# Patient Record
Sex: Male | Born: 1950
Health system: Southern US, Community
[De-identification: ages and names within clinical notes are randomized; demographics above are authoritative.]

## PROBLEM LIST (undated history)

## (undated) DIAGNOSIS — E8881 Metabolic syndrome: Secondary | ICD-10-CM

## (undated) DIAGNOSIS — I10 Essential (primary) hypertension: Secondary | ICD-10-CM

## (undated) DIAGNOSIS — K219 Gastro-esophageal reflux disease without esophagitis: Secondary | ICD-10-CM

## (undated) DIAGNOSIS — Z86010 Personal history of colonic polyps: Secondary | ICD-10-CM

## (undated) DIAGNOSIS — E1165 Type 2 diabetes mellitus with hyperglycemia: Secondary | ICD-10-CM

## (undated) HISTORY — DX: Type 2 diabetes mellitus with hyperglycemia: E11.65

## (undated) HISTORY — DX: Essential (primary) hypertension: I10

## (undated) HISTORY — DX: Metabolic syndrome: E88.81

## (undated) HISTORY — DX: Gastro-esophageal reflux disease without esophagitis: K21.9

## (undated) HISTORY — PX: TONSILLECTOMY: SUR1361

## (undated) HISTORY — DX: Personal history of colonic polyps: Z86.010

---

## 2002-06-18 ENCOUNTER — Encounter: Payer: Self-pay | Admitting: Cardiology

## 2002-06-18 ENCOUNTER — Ambulatory Visit (HOSPITAL_COMMUNITY): Admission: RE | Admit: 2002-06-18 | Discharge: 2002-06-18 | Payer: Self-pay | Admitting: Cardiology

## 2003-06-05 ENCOUNTER — Ambulatory Visit (HOSPITAL_COMMUNITY): Admission: RE | Admit: 2003-06-05 | Discharge: 2003-06-05 | Payer: Self-pay | Admitting: Gastroenterology

## 2003-06-05 ENCOUNTER — Encounter (INDEPENDENT_AMBULATORY_CARE_PROVIDER_SITE_OTHER): Payer: Self-pay | Admitting: *Deleted

## 2008-06-28 ENCOUNTER — Encounter: Payer: Self-pay | Admitting: Family Medicine

## 2009-01-28 ENCOUNTER — Ambulatory Visit: Payer: Self-pay | Admitting: Family Medicine

## 2009-01-28 LAB — CONVERTED CEMR LAB
ALT: 44 units/L (ref 0–53)
AST: 39 units/L — ABNORMAL HIGH (ref 0–37)
Albumin: 3.9 g/dL (ref 3.5–5.2)
Alkaline Phosphatase: 59 units/L (ref 39–117)
BUN: 26 mg/dL — ABNORMAL HIGH (ref 6–23)
Basophils Absolute: 0 10*3/uL (ref 0.0–0.1)
Basophils Relative: 0.2 % (ref 0.0–3.0)
Bilirubin Urine: NEGATIVE
Bilirubin, Direct: 0.1 mg/dL (ref 0.0–0.3)
Blood in Urine, dipstick: NEGATIVE
CO2: 28 meq/L (ref 19–32)
Calcium: 9.3 mg/dL (ref 8.4–10.5)
Chloride: 102 meq/L (ref 96–112)
Cholesterol: 205 mg/dL — ABNORMAL HIGH (ref 0–200)
Creatinine, Ser: 1.3 mg/dL (ref 0.4–1.5)
Direct LDL: 142.3 mg/dL
Eosinophils Absolute: 0.2 10*3/uL (ref 0.0–0.7)
Eosinophils Relative: 2.9 % (ref 0.0–5.0)
GFR calc non Af Amer: 60.18 mL/min (ref >60)
Glucose, Bld: 184 mg/dL — ABNORMAL HIGH (ref 70–99)
Glucose, Urine, Semiquant: NEGATIVE
HCT: 40.6 % (ref 39.0–52.0)
HDL: 37.8 mg/dL — ABNORMAL LOW (ref >39.00)
Hemoglobin: 14.2 g/dL (ref 13.0–17.0)
Ketones, urine, test strip: NEGATIVE
Lymphocytes Relative: 26.7 % (ref 12.0–46.0)
Lymphs Abs: 1.7 10*3/uL (ref 0.7–4.0)
MCHC: 35 g/dL (ref 30.0–36.0)
MCV: 90.5 fL (ref 78.0–100.0)
Monocytes Absolute: 0.6 10*3/uL (ref 0.1–1.0)
Monocytes Relative: 9.7 % (ref 3.0–12.0)
Neutro Abs: 3.7 10*3/uL (ref 1.4–7.7)
Neutrophils Relative %: 60.5 % (ref 43.0–77.0)
Nitrite: NEGATIVE
PSA: 0.77 ng/mL (ref 0.10–4.00)
Platelets: 237 10*3/uL (ref 150.0–400.0)
Potassium: 4.1 meq/L (ref 3.5–5.1)
RBC: 4.49 M/uL (ref 4.22–5.81)
RDW: 11.8 % (ref 11.5–14.6)
Sodium: 137 meq/L (ref 135–145)
Specific Gravity, Urine: 1.02
TSH: 0.35 microintl units/mL (ref 0.35–5.50)
Total Bilirubin: 1 mg/dL (ref 0.3–1.2)
Total CHOL/HDL Ratio: 5
Total Protein: 7 g/dL (ref 6.0–8.3)
Triglycerides: 186 mg/dL — ABNORMAL HIGH (ref 0.0–149.0)
Urobilinogen, UA: 0.2
VLDL: 37.2 mg/dL (ref 0.0–40.0)
WBC Urine, dipstick: NEGATIVE
WBC: 6.2 10*3/uL (ref 4.5–10.5)
pH: 6

## 2009-01-31 DIAGNOSIS — I10 Essential (primary) hypertension: Secondary | ICD-10-CM

## 2009-01-31 HISTORY — DX: Essential (primary) hypertension: I10

## 2009-02-03 ENCOUNTER — Ambulatory Visit: Payer: Self-pay | Admitting: Family Medicine

## 2009-02-03 DIAGNOSIS — E8881 Metabolic syndrome: Secondary | ICD-10-CM

## 2009-02-03 DIAGNOSIS — Z860100 Personal history of colon polyps, unspecified: Secondary | ICD-10-CM

## 2009-02-03 DIAGNOSIS — IMO0001 Reserved for inherently not codable concepts without codable children: Secondary | ICD-10-CM

## 2009-02-03 DIAGNOSIS — K219 Gastro-esophageal reflux disease without esophagitis: Secondary | ICD-10-CM | POA: Insufficient documentation

## 2009-02-03 DIAGNOSIS — E1165 Type 2 diabetes mellitus with hyperglycemia: Secondary | ICD-10-CM

## 2009-02-03 HISTORY — DX: Personal history of colonic polyps: Z86.010

## 2009-02-03 HISTORY — DX: Metabolic syndrome: E88.81

## 2009-02-03 HISTORY — DX: Reserved for inherently not codable concepts without codable children: IMO0001

## 2009-02-03 HISTORY — DX: Gastro-esophageal reflux disease without esophagitis: K21.9

## 2009-02-03 HISTORY — DX: Metabolic syndrome: E88.810

## 2009-02-03 HISTORY — DX: Personal history of colon polyps, unspecified: Z86.0100

## 2009-02-03 LAB — CONVERTED CEMR LAB
Cholesterol, target level: 200 mg/dL
HDL goal, serum: 40 mg/dL
LDL Goal: 100 mg/dL

## 2009-02-14 ENCOUNTER — Telehealth: Payer: Self-pay | Admitting: Family Medicine

## 2009-04-02 ENCOUNTER — Ambulatory Visit: Payer: Self-pay | Admitting: Family Medicine

## 2009-04-03 LAB — CONVERTED CEMR LAB
Cholesterol: 197 mg/dL (ref 0–200)
Creatinine,U: 122.7 mg/dL
HDL: 37 mg/dL — ABNORMAL LOW (ref >39.00)
Hgb A1c MFr Bld: 6.6 % — ABNORMAL HIGH (ref 4.6–6.5)
LDL Cholesterol: 137 mg/dL — ABNORMAL HIGH (ref 0–99)
Microalb Creat Ratio: 1.6 mg/g (ref 0.0–30.0)
Microalb, Ur: 0.2 mg/dL (ref 0.0–1.9)
TSH: 0.26 microintl units/mL — ABNORMAL LOW (ref 0.35–5.50)
Total CHOL/HDL Ratio: 5
Triglycerides: 115 mg/dL (ref 0.0–149.0)
VLDL: 23 mg/dL (ref 0.0–40.0)

## 2009-04-08 ENCOUNTER — Telehealth: Payer: Self-pay | Admitting: *Deleted

## 2009-05-21 ENCOUNTER — Ambulatory Visit: Payer: Self-pay | Admitting: Family Medicine

## 2009-05-22 LAB — CONVERTED CEMR LAB
Free T4: 0.8 ng/dL (ref 0.6–1.6)
TSH: 1.46 microintl units/mL (ref 0.35–5.50)

## 2009-08-07 ENCOUNTER — Telehealth: Payer: Self-pay | Admitting: Family Medicine

## 2010-01-22 ENCOUNTER — Ambulatory Visit: Payer: Self-pay | Admitting: Family Medicine

## 2010-01-22 LAB — CONVERTED CEMR LAB
ALT: 27 units/L (ref 0–53)
AST: 26 units/L (ref 0–37)
Albumin: 4.2 g/dL (ref 3.5–5.2)
Alkaline Phosphatase: 49 units/L (ref 39–117)
BUN: 18 mg/dL (ref 6–23)
Basophils Absolute: 0 10*3/uL (ref 0.0–0.1)
Basophils Relative: 0.1 % (ref 0.0–3.0)
Bilirubin Urine: NEGATIVE
Bilirubin, Direct: 0.1 mg/dL (ref 0.0–0.3)
Blood in Urine, dipstick: NEGATIVE
CO2: 29 meq/L (ref 19–32)
Calcium: 9.7 mg/dL (ref 8.4–10.5)
Chloride: 100 meq/L (ref 96–112)
Cholesterol: 194 mg/dL (ref 0–200)
Creatinine, Ser: 1.1 mg/dL (ref 0.4–1.5)
Eosinophils Absolute: 0.1 10*3/uL (ref 0.0–0.7)
Eosinophils Relative: 2.2 % (ref 0.0–5.0)
GFR calc non Af Amer: 70.51 mL/min (ref >60)
Glucose, Bld: 105 mg/dL — ABNORMAL HIGH (ref 70–99)
Glucose, Urine, Semiquant: NEGATIVE
HCT: 42.7 % (ref 39.0–52.0)
HDL: 42 mg/dL (ref >39.00)
Hemoglobin: 14.6 g/dL (ref 13.0–17.0)
Ketones, urine, test strip: NEGATIVE
LDL Cholesterol: 119 mg/dL — ABNORMAL HIGH (ref 0–99)
Lymphocytes Relative: 30.3 % (ref 12.0–46.0)
Lymphs Abs: 1.9 10*3/uL (ref 0.7–4.0)
MCHC: 34.1 g/dL (ref 30.0–36.0)
MCV: 91.9 fL (ref 78.0–100.0)
Monocytes Absolute: 0.6 10*3/uL (ref 0.1–1.0)
Monocytes Relative: 8.9 % (ref 3.0–12.0)
Neutro Abs: 3.7 10*3/uL (ref 1.4–7.7)
Neutrophils Relative %: 58.5 % (ref 43.0–77.0)
Nitrite: NEGATIVE
PSA: 0.75 ng/mL (ref 0.10–4.00)
Platelets: 235 10*3/uL (ref 150.0–400.0)
Potassium: 4.7 meq/L (ref 3.5–5.1)
Protein, U semiquant: NEGATIVE
RBC: 4.65 M/uL (ref 4.22–5.81)
RDW: 13.1 % (ref 11.5–14.6)
Sodium: 138 meq/L (ref 135–145)
Specific Gravity, Urine: 1.015
TSH: 1.82 microintl units/mL (ref 0.35–5.50)
Total Bilirubin: 1 mg/dL (ref 0.3–1.2)
Total CHOL/HDL Ratio: 5
Total Protein: 6.8 g/dL (ref 6.0–8.3)
Triglycerides: 166 mg/dL — ABNORMAL HIGH (ref 0.0–149.0)
Urobilinogen, UA: 0.2
VLDL: 33.2 mg/dL (ref 0.0–40.0)
WBC Urine, dipstick: NEGATIVE
WBC: 6.3 10*3/uL (ref 4.5–10.5)
pH: 8.5

## 2010-02-04 ENCOUNTER — Ambulatory Visit: Payer: Self-pay | Admitting: Family Medicine

## 2010-03-02 ENCOUNTER — Ambulatory Visit: Payer: Self-pay | Admitting: Family Medicine

## 2010-08-18 NOTE — Progress Notes (Signed)
Summary: refill Toprol XL for 1 year  Phone Note Call from Patient Call back at Home Phone [phone] Call back at [phone]   Caller: Patient-live call Reason for Call: Refill Medication Summary of Call: Refill Generic Toprol XL. Send rx to [facility]. Initial call taken by: Warnell Forester,  August 07, 2009 1:16 PM    Prescriptions: TOPROL XL 50 MG XR24H-TAB (METOPROLOL SUCCINATE) once daily  #90 x 3   Entered by:   Sid Falcon LPN   Authorized by:   Evelena Peat MD   Signed by:   Sid Falcon LPN on 09/81/1914   Method used:   Faxed to ...       [facility])       [address] [address]       [place], [place]  [address]       Ph: [phone]       Fax: [phone]   RxID:   [id number]

## 2010-08-18 NOTE — Assessment & Plan Note (Signed)
Summary: CPX/CJR   Vital Signs:  Patient profile:   60 year old male Height:      73.50 inches Weight:      234 pounds BMI:     30.56 Temp:     98.8 degrees F oral Pulse rate:   72 / minute Pulse rhythm:   regular Resp:     12 per minute BP sitting:   110 / 80  (left arm) Cuff size:   regular  Vitals Entered By: Sid Falcon LPN (February 04, 2010 9:01 AM)  Nutrition Counseling: Patient's BMI is greater than 25 and therefore counseled on weight management options. CC: CPX, labs done   History of Present Illness: Patient seen for complete physical examination.  Colonoscopy 2009. Repeat 2014. Tetanus 2007. Patient treated for hypertension. Reported history of nonischemic cardiomyopathy several years ago. Has been followed by cardiologist until recently. No recent symptoms of shortness of breath. No chest pains.  Family history and social history reviewed no significant changes.  Clinical Review Panels:  Prevention   Last Colonoscopy:  Adenomatous Polyp (06/28/2008)   Last PSA:  0.75 (01/22/2010)  Immunizations   Last Tetanus Booster:  Historical (02/04/2006)  Lipid Management   Cholesterol:  194 (01/22/2010)   LDL (bad choesterol):  119 (01/22/2010)   HDL (good cholesterol):  42.00 (01/22/2010)  Diabetes Management   HgBA1C:  6.6 (04/02/2009)   Creatinine:  1.1 (01/22/2010)  CBC   WBC:  6.3 (01/22/2010)   RBC:  4.65 (01/22/2010)   Hgb:  14.6 (01/22/2010)   Hct:  42.7 (01/22/2010)   Platelets:  235.0 (01/22/2010)   MCV  91.9 (01/22/2010)   MCHC  34.1 (01/22/2010)   RDW  13.1 (01/22/2010)   PMN:  58.5 (01/22/2010)   Lymphs:  30.3 (01/22/2010)   Monos:  8.9 (01/22/2010)   Eosinophils:  2.2 (01/22/2010)   Basophil:  0.1 (01/22/2010)   Allergies (verified): No Known Drug Allergies  Past History:  Past Medical History: Last updated: 02/03/2009 Hypertension Mild dyslipidemia Type 2 diabetes dx 7/10 Exogenous Obesity Erectile  dysfunction GERD UTI Colonic polyps, hx of hypertensive cardiomyopathy (Echo 2004, EF 49%) Metabolic Syndrome (htn, type 2 dm, dyslipidemia, increased waist/hip)  Family History: Last updated: 02/03/2009 Family history breast cancer, parent Family History High cholesterol Family History Hypertension, parent, grandparent Faamily history heart disease, parent  Family historu diabetes, parent Family history colon cancer, grandparent  Social History: Last updated: 02/03/2009 Occupation:  Land Surveyor Married Never Smoked Alcohol use-no Regular exercise-no  Risk Factors: Exercise: no (02/03/2009)  Risk Factors: Smoking Status: never (02/03/2009) PMH-FH-SH reviewed for relevance  Review of Systems  The patient denies anorexia, fever, weight loss, weight gain, vision loss, decreased hearing, hoarseness, chest pain, syncope, dyspnea on exertion, peripheral edema, prolonged cough, headaches, hemoptysis, abdominal pain, melena, hematochezia, severe indigestion/heartburn, hematuria, incontinence, genital sores, muscle weakness, suspicious skin lesions, transient blindness, difficulty walking, depression, unusual weight change, abnormal bleeding, enlarged lymph nodes, and testicular masses.    Physical Exam  General:  Well-developed,well-nourished,in no acute distress; alert,appropriate and cooperative throughout examination Head:  Normocephalic and atraumatic without obvious abnormalities. No apparent alopecia or balding. Eyes:  No corneal or conjunctival inflammation noted. EOMI. Perrla. Funduscopic exam benign, without hemorrhages, exudates or papilledema. Vision grossly normal. Ears:  External ear exam shows no significant lesions or deformities.  Otoscopic examination reveals clear canals, tympanic membranes are intact bilaterally without bulging, retraction, inflammation or discharge. Hearing is grossly normal bilaterally. Mouth:  Oral mucosa and oropharynx without lesions or  exudates.  Teeth in good repair. Neck:  No deformities, masses, or tenderness noted. Lungs:  Normal respiratory effort, chest expands symmetrically. Lungs are clear to auscultation, no crackles or wheezes. Heart:  Normal rate and regular rhythm. S1 and S2 normal without gallop, murmur, click, rub or other extra sounds. Abdomen:  Bowel sounds positive,abdomen soft and non-tender without masses, organomegaly or hernias noted. Rectal:  No external abnormalities noted. Normal sphincter tone. No rectal masses or tenderness. Prostate:  Prostate gland firm and smooth, no enlargement, nodularity, tenderness, mass, asymmetry or induration. Msk:  No deformity or scoliosis noted of thoracic or lumbar spine.   Extremities:  No clubbing, cyanosis, edema, or deformity noted with normal full range of motion of all joints.   Neurologic:  alert & oriented X3, cranial nerves II-XII intact, and strength normal in all extremities.   Skin:  no rashes and no suspicious lesions.   Psych:  normally interactive, good eye contact, not anxious appearing, and not depressed appearing.     Impression & Recommendations:  Problem # 1:  ROUTINE GENERAL MEDICAL EXAM@HEALTH  CARE FACL (ICD-V70.0) work on weight loss. Establish more regular exercise. Labs reviewed with patient. Immunizations up to date.  Complete Medication List: 1)  Hydrochlorothiazide 25 Mg Tabs (Hydrochlorothiazide) .... Once daily 2)  Aspirin 81 Mg Tabs (Aspirin) .... Once daily 3)  Toprol Xl 50 Mg Xr24h-tab (Metoprolol succinate) .... Once daily 4)  Lisinopril 40 Mg Tabs (Lisinopril) .... Once daily  Patient Instructions: 1)  It is important that you exercise reguarly at least 20 minutes 5 times a week. If you develop chest pain, have severe difficulty breathing, or feel very tired, stop exercising immediately and seek medical attention.  2)  You need to lose weight. Consider a lower calorie diet and regular exercise.  Prescriptions: LISINOPRIL 40 MG  TABS (LISINOPRIL) once daily  #90 x 3   Entered and Authorized by:   Evelena Peat MD   Signed by:   Evelena Peat MD on 02/04/2010   Method used:   Faxed to ...       [facility])       [address] [address]       [place], [place]  [address]       Ph: [phone]       Fax: [phone]   RxID:   [id number] TOPROL XL 50 MG XR24H-TAB (METOPROLOL SUCCINATE) once daily  #90 x 3   Entered and Authorized by:   Evelena Peat MD   Signed by:   Evelena Peat MD on 02/04/2010   Method used:   Faxed to ...       [facility] education [facility])       [address] [address]       [place], [place]  [address]       Ph: [phone]       Fax: [phone]   RxID:   [id number] HYDROCHLOROTHIAZIDE 25 MG TABS (HYDROCHLOROTHIAZIDE) once daily  #90 x 3   Entered and Authorized by:   Evelena Peat MD   Signed by:   Evelena Peat MD on 02/04/2010   Method used:   Faxed to ...       [facility] education officer)       [address] [address]       [place], [place]  [address]       Ph: [phone]       Fax: [phone]   RxID:   [id number]

## 2010-08-18 NOTE — Assessment & Plan Note (Signed)
Summary: KNEE PAIN // RS   Vital Signs:  Patient profile:   60 year old male Weight:      235 pounds Temp:     97.8 degrees F oral BP sitting:   130 / 92  (left arm) Cuff size:   large  Vitals Entered By: Sid Falcon LPN (March 02, 2010 10:37 AM) CC: Injury to left knee 4 days ago   History of Present Illness: Left knee pain and swelling. He recalls hitting his left knee against desk surface last Thursday. Did not have any significant pain immediately. By the weekend noticed some redness and warmth and mild swelling. No systemic fever or chills. Motrin helps.  no history of gout  Allergies (verified): No Known Drug Allergies  Past History:  Past Medical History: Last updated: 02/03/2009 Hypertension Mild dyslipidemia Type 2 diabetes dx 7/10 Exogenous Obesity Erectile dysfunction GERD UTI Colonic polyps, hx of hypertensive cardiomyopathy (Echo 2004, EF 49%) Metabolic Syndrome (htn, type 2 dm, dyslipidemia, increased waist/hip) PMH reviewed for relevance  Physical Exam  General:  Well-developed,well-nourished,in no acute distress; alert,appropriate and cooperative throughout examination Lungs:  Normal respiratory effort, chest expands symmetrically. Lungs are clear to auscultation, no crackles or wheezes. Heart:  Normal rate and regular rhythm. S1 and S2 normal without gallop, murmur, click, rub or other extra sounds. Extremities:  left knee reveals mild anterior swelling. He has an area of redness approximately 8 x 10 cm left anterior knee. No effusion. Slightly warm to touch. Minimally tender. No medial or lateral joint line tenderness. No obvious abrasions to the skin No prepatellar effusion.   Impression & Recommendations:  Problem # 1:  CELLULITIS, KNEE, LEFT (ICD-682.6) keflex, heating pad and follow up one week if no better. His updated medication list for this problem includes:    Cephalexin 500 Mg Caps (Cephalexin) ..... One by mouth three times a day for  10 days  Complete Medication List: 1)  Hydrochlorothiazide 25 Mg Tabs (Hydrochlorothiazide) .... Once daily 2)  Aspirin 81 Mg Tabs (Aspirin) .... Once daily 3)  Toprol Xl 50 Mg Xr24h-tab (Metoprolol succinate) .... Once daily 4)  Lisinopril 40 Mg Tabs (Lisinopril) .... Once daily 5)  Cephalexin 500 Mg Caps (Cephalexin) .... One by mouth three times a day for 10 days  Patient Instructions: 1)  Elevate leg and knee frequently 2)  Consider heating pad on low to medium heat several times daily 3)  Followup if knee not improving in the next few days Prescriptions: CEPHALEXIN 500 MG CAPS (CEPHALEXIN) one by mouth three times a day for 10 days  #30 x 0   Entered and Authorized by:   Evelena Peat MD   Signed by:   Evelena Peat MD on 03/02/2010   Method used:   Electronically to        [facility]  [place] [address] (retail)       [address]       [place], [place]  [address]       Ph: [phone] or [phone]       Fax: [phone]   RxID:   [id number]

## 2010-12-04 NOTE — Op Note (Signed)
Jared Byrd, Jared Byrd                           ACCOUNT NO.:  [id number]   MEDICAL RECORD NO.:  [id number]                   PATIENT TYPE:  AMB   LOCATION:  ENDO                                 FACILITY:  [facility]   PHYSICIAN:  Anselmo Rod, M.D.               DATE OF BIRTH:  [birth year]   DATE OF PROCEDURE:  06/05/2003  DATE OF DISCHARGE:                                 OPERATIVE REPORT   PROCEDURE:  Colonoscopy with snare polypectomy x2.   ENDOSCOPIST:  Anselmo Rod, M.D.   INSTRUMENT USED:  Olympus video colonoscope.   INDICATIONS FOR PROCEDURE:  A 60 year old white male with a family history  of colon cancer in a sister and multiple colonic polyps in his mother  undergoing a screening colonoscopy.   PREPROCEDURE PREPARATION:  Informed consent was procured from the patient.  The patient fasted for eight hours prior to the procedure and prepped with a  bottle of magnesium citrate and a gallon of GoLYTELY the night prior to the  procedure.   PREPROCEDURE PHYSICAL:  The patient had stable vital signs. Neck supple.  Chest clear to auscultation. S1, S2 regular. Abdomen soft with normal bowel  sounds.   DESCRIPTION OF PROCEDURE:  The patient was placed in the left lateral  decubitus position and sedated with 50 mg of Demerol and 5 mg of Versed  intravenously. Once the patient was adequately sedated and maintained on low  flow oxygen and continuous cardiac monitoring, the Olympus video colonoscope  was advanced from the rectum to the cecum without difficulty. The  appendiceal orifice and ileocecal valve were visualized and photographed. A  6-7 mm sessile polyp was snared from 55 cm, another 3-4 mm sessile polyp was  snared from 35 cm. The patient tolerated the procedure well without  complications. The terminal ileum appeared healthy and without lesions.  Retroflexion in the rectum revealed no abnormalities.   IMPRESSION:  Two colonic polyps removed 55 and 35 cm respectively  otherwise  normal colonoscopy of the terminal ileum.   RECOMMENDATIONS:  1. Await pathology results.  2. Avoid all nonsteroidals including aspirin for the next 3-4 weeks.  3. Repeat colorectal cancer screening depending on pathology results.                                               Anselmo Rod, M.D.    JNM/MEDQ  D:  06/05/2003  T:  06/06/2003  Job:  [id number]   cc:   Teena Irani. Arlyce Dice, M.D.  [address]  [place]  [place] [address]  Fax: [phone]

## 2011-01-06 ENCOUNTER — Other Ambulatory Visit (INDEPENDENT_AMBULATORY_CARE_PROVIDER_SITE_OTHER): Payer: BC Managed Care – PPO

## 2011-01-06 DIAGNOSIS — Z0000 Encounter for general adult medical examination without abnormal findings: Secondary | ICD-10-CM

## 2011-01-06 LAB — HEPATIC FUNCTION PANEL
AST: 29 U/L (ref 0–37)
Alkaline Phosphatase: 58 U/L (ref 39–117)
Bilirubin, Direct: 0.2 mg/dL (ref 0.0–0.3)
Total Bilirubin: 1.1 mg/dL (ref 0.3–1.2)

## 2011-01-06 LAB — BASIC METABOLIC PANEL
CO2: 25 mEq/L (ref 19–32)
Calcium: 9.3 mg/dL (ref 8.4–10.5)
Creatinine, Ser: 1.1 mg/dL (ref 0.4–1.5)
GFR: 74.85 mL/min (ref >60.00)
Glucose, Bld: 125 mg/dL — ABNORMAL HIGH (ref 70–99)

## 2011-01-06 LAB — LIPID PANEL
Cholesterol: 243 mg/dL — ABNORMAL HIGH (ref 0–200)
Total CHOL/HDL Ratio: 5
VLDL: 42.4 mg/dL — ABNORMAL HIGH (ref 0.0–40.0)

## 2011-01-06 LAB — CBC WITH DIFFERENTIAL/PLATELET
Basophils Relative: 0.4 % (ref 0.0–3.0)
Eosinophils Relative: 2 % (ref 0.0–5.0)
Lymphs Abs: 1.8 10*3/uL (ref 0.7–4.0)
Monocytes Absolute: 0.6 10*3/uL (ref 0.1–1.0)
Neutro Abs: 3.8 10*3/uL (ref 1.4–7.7)
Platelets: 238 10*3/uL (ref 150.0–400.0)
RBC: 4.58 Mil/uL (ref 4.22–5.81)

## 2011-01-06 LAB — POCT URINALYSIS DIPSTICK
Glucose, UA: NEGATIVE
Ketones, UA: NEGATIVE
Leukocytes, UA: NEGATIVE
Spec Grav, UA: 1.02

## 2011-01-06 LAB — LDL CHOLESTEROL, DIRECT: Direct LDL: 174.1 mg/dL

## 2011-01-11 ENCOUNTER — Encounter: Payer: Self-pay | Admitting: Family Medicine

## 2011-01-13 ENCOUNTER — Ambulatory Visit (INDEPENDENT_AMBULATORY_CARE_PROVIDER_SITE_OTHER): Payer: BC Managed Care – PPO | Admitting: Family Medicine

## 2011-01-13 ENCOUNTER — Encounter: Payer: Self-pay | Admitting: Family Medicine

## 2011-01-13 VITALS — BP 110/80 | HR 72 | Temp 98.1°F | Resp 12 | Ht 73.0 in | Wt 249.0 lb

## 2011-01-13 DIAGNOSIS — E785 Hyperlipidemia, unspecified: Secondary | ICD-10-CM

## 2011-01-13 DIAGNOSIS — R7309 Other abnormal glucose: Secondary | ICD-10-CM

## 2011-01-13 DIAGNOSIS — R7303 Prediabetes: Secondary | ICD-10-CM

## 2011-01-13 DIAGNOSIS — Z0000 Encounter for general adult medical examination without abnormal findings: Secondary | ICD-10-CM

## 2011-01-13 MED ORDER — HYDROCHLOROTHIAZIDE 25 MG PO TABS: 25.0000 mg | ORAL_TABLET | Freq: Every day | ORAL | Status: DC

## 2011-01-13 MED ORDER — LISINOPRIL 40 MG PO TABS: 40.0000 mg | ORAL_TABLET | Freq: Every day | ORAL | Status: DC

## 2011-01-13 MED ORDER — METOPROLOL SUCCINATE ER 50 MG PO TB24: 50.0000 mg | ORAL_TABLET | Freq: Every day | ORAL | Status: DC

## 2011-01-13 NOTE — Patient Instructions (Signed)
Work on establishing more regular exercise.   Work on gradual weight loss Reduce high glycemic foods Check on insurance coverage for shingles vaccine Return in approximately 6 months for fasting glucose and lipid panel

## 2011-01-13 NOTE — Progress Notes (Signed)
  Subjective:    Patient ID: Jared Byrd, male    DOB: 09-09-1950, 60 y.o.   MRN: [id number]  HPI Patient here for complete physical examination. Hypertension treated with 3 drug regimen. Blood pressure well controlled. No orthostasis. Tetanus up-to-date. Last colonoscopy around 2009. Due for repeat in a couple of years. Previous history of colon polyps.  Prior history prediabetes. No regular exercise. Poorly compliant with diet off and on her past year. No symptoms of hyperglycemia.   Review of Systems  Constitutional: Negative for fever, activity change, appetite change and fatigue.  HENT: Negative for ear pain, congestion and trouble swallowing.   Eyes: Negative for pain and visual disturbance.  Respiratory: Negative for cough, shortness of breath and wheezing.   Cardiovascular: Negative for chest pain and palpitations.  Gastrointestinal: Negative for nausea, vomiting, abdominal pain, diarrhea, constipation, blood in stool, abdominal distention and rectal pain.  Genitourinary: Negative for dysuria, hematuria and testicular pain.  Musculoskeletal: Negative for joint swelling and arthralgias.  Skin: Negative for rash.  Neurological: Negative for dizziness, syncope and headaches.  Hematological: Negative for adenopathy.  Psychiatric/Behavioral: Negative for confusion and dysphoric mood.       Objective:   Physical Exam  Constitutional: He is oriented to person, place, and time. He appears well-developed and well-nourished. No distress.  HENT:  Head: Normocephalic and atraumatic.  Right Ear: External ear normal.  Left Ear: External ear normal.  Mouth/Throat: Oropharynx is clear and moist.  Eyes: Conjunctivae and EOM are normal. Pupils are equal, round, and reactive to light.  Neck: Normal range of motion. Neck supple. No thyromegaly present.  Cardiovascular: Normal rate, regular rhythm and normal heart sounds.   No murmur heard. Pulmonary/Chest: No respiratory distress. He has no  wheezes. He has no rales.  Abdominal: Soft. Bowel sounds are normal. He exhibits no distension and no mass. There is no tenderness. There is no rebound and no guarding.  Musculoskeletal: He exhibits no edema.  Lymphadenopathy:    He has no cervical adenopathy.  Neurological: He is alert and oriented to person, place, and time. He displays normal reflexes. No cranial nerve deficit.  Skin: No rash noted.  Psychiatric: He has a normal mood and affect. His behavior is normal.          Assessment & Plan:  Complete physical examination. Tetanus up-to-date. Colonoscopy up to date. Check on insurance coverage for shingles vaccine. Establish more regular exercise. Return 6 months and repeat lipids and glucose.

## 2011-05-04 ENCOUNTER — Ambulatory Visit (INDEPENDENT_AMBULATORY_CARE_PROVIDER_SITE_OTHER): Payer: BC Managed Care – PPO | Admitting: Family Medicine

## 2011-05-04 DIAGNOSIS — Z23 Encounter for immunization: Secondary | ICD-10-CM

## 2011-07-06 ENCOUNTER — Other Ambulatory Visit: Payer: BC Managed Care – PPO

## 2011-07-15 ENCOUNTER — Ambulatory Visit: Payer: BC Managed Care – PPO | Admitting: Family Medicine

## 2011-08-04 ENCOUNTER — Other Ambulatory Visit (INDEPENDENT_AMBULATORY_CARE_PROVIDER_SITE_OTHER): Payer: BC Managed Care – PPO

## 2011-08-04 DIAGNOSIS — R7309 Other abnormal glucose: Secondary | ICD-10-CM

## 2011-08-04 DIAGNOSIS — E785 Hyperlipidemia, unspecified: Secondary | ICD-10-CM

## 2011-08-04 DIAGNOSIS — R7303 Prediabetes: Secondary | ICD-10-CM

## 2011-08-04 LAB — LIPID PANEL
HDL: 41 mg/dL (ref >39.00)
VLDL: 53.8 mg/dL — ABNORMAL HIGH (ref 0.0–40.0)

## 2011-08-04 LAB — LDL CHOLESTEROL, DIRECT: Direct LDL: 150.2 mg/dL

## 2011-08-11 ENCOUNTER — Encounter: Payer: Self-pay | Admitting: Family Medicine

## 2011-08-11 ENCOUNTER — Ambulatory Visit (INDEPENDENT_AMBULATORY_CARE_PROVIDER_SITE_OTHER): Payer: BC Managed Care – PPO | Admitting: Family Medicine

## 2011-08-11 DIAGNOSIS — I10 Essential (primary) hypertension: Secondary | ICD-10-CM

## 2011-08-11 DIAGNOSIS — E8881 Metabolic syndrome: Secondary | ICD-10-CM

## 2011-08-11 NOTE — Patient Instructions (Signed)
Establish more regular exercise and continue with weight loss efforts.

## 2011-08-11 NOTE — Progress Notes (Signed)
  Subjective:    Patient ID: Jared Byrd, male    DOB: May 30, 1951, 61 y.o.   MRN: [id number]  HPI  Patient is seen for followup. Has metabolic syndrome with type 2 diabetes, hypertension, low HDL and high triglyceride. Blood pressure well controlled. Recent labs significant for fasting glucose 165 with dyslipidemia. He has been fairly compliant with diet but not exercising any. Blood pressure well controlled. No symptoms of hyperglycemia. He has been very reluctant to consider additional medications.  Past Medical History  Diagnosis Date  . DIAB W/O MENTION COMP TYPE II/UNS TYPE UNCNTRL 02/03/2009  . METABOLIC SYNDROME X 02/03/2009  . HYPERTENSION 01/31/2009  . GERD 02/03/2009  . COLONIC POLYPS, HX OF 02/03/2009   No past surgical history on file.  reports that he quit smoking about 35 years ago. His smoking use included Cigarettes. He has a 5 pack-year smoking history. He does not have any smokeless tobacco history on file. His alcohol and drug histories not on file. family history includes Cancer in his mother and other; Cancer (age of onset:48) in his sister; Heart disease in his other; Hyperlipidemia in his other; and Hypertension in his other. No Known Allergies    Review of Systems  Constitutional: Negative for fatigue.  Eyes: Negative for visual disturbance.  Respiratory: Negative for cough, chest tightness and shortness of breath.   Cardiovascular: Negative for chest pain, palpitations and leg swelling.  Neurological: Negative for dizziness, syncope, weakness, light-headedness and headaches.       Objective:   Physical Exam  Constitutional: He appears well-developed and well-nourished.  HENT:  Mouth/Throat: Oropharynx is clear and moist.  Neck: Neck supple. No thyromegaly present.  Cardiovascular: Normal rate and regular rhythm.   No murmur heard. Pulmonary/Chest: Effort normal and breath sounds normal. No respiratory distress. He has no wheezes. He has no rales.    Musculoskeletal: He exhibits no edema.  Lymphadenopathy:    He has no cervical adenopathy.          Assessment & Plan:  #1 type 2 diabetes. Elevated fasting glucose as above. Discussed possible medication options. Patient refers weight loss. Increase exercise and weight loss and reassess A1c in 3 months .  Metformin at that time if A1C not to goal. #2 dyslipidemia. We stated a goal LDL less than 100. Patient reluctant to try statin. Repeat lipid followup. Remain on omega-3 supplement  #3 hypertension stable at goal currently medication

## 2011-11-10 ENCOUNTER — Other Ambulatory Visit (INDEPENDENT_AMBULATORY_CARE_PROVIDER_SITE_OTHER): Payer: BC Managed Care – PPO

## 2011-11-10 DIAGNOSIS — E8881 Metabolic syndrome: Secondary | ICD-10-CM

## 2011-11-10 LAB — LIPID PANEL
Cholesterol: 202 mg/dL — ABNORMAL HIGH (ref 0–200)
HDL: 43.6 mg/dL (ref >39.00)
Total CHOL/HDL Ratio: 5
Triglycerides: 166 mg/dL — ABNORMAL HIGH (ref 0.0–149.0)
VLDL: 33.2 mg/dL (ref 0.0–40.0)

## 2011-11-10 LAB — HEMOGLOBIN A1C: Hgb A1c MFr Bld: 7.1 % — ABNORMAL HIGH (ref 4.6–6.5)

## 2011-11-30 ENCOUNTER — Telehealth: Payer: Self-pay | Admitting: Family Medicine

## 2011-11-30 NOTE — Telephone Encounter (Signed)
Pt called and is req to get lab results from April. Pls call.

## 2011-11-30 NOTE — Telephone Encounter (Signed)
Labs mailed to pt home for review

## 2012-01-06 ENCOUNTER — Other Ambulatory Visit (INDEPENDENT_AMBULATORY_CARE_PROVIDER_SITE_OTHER): Payer: BC Managed Care – PPO

## 2012-01-06 DIAGNOSIS — Z0000 Encounter for general adult medical examination without abnormal findings: Secondary | ICD-10-CM

## 2012-01-06 LAB — CBC WITH DIFFERENTIAL/PLATELET
Basophils Absolute: 0 10*3/uL (ref 0.0–0.1)
Eosinophils Relative: 2.1 % (ref 0.0–5.0)
Hemoglobin: 14.3 g/dL (ref 13.0–17.0)
Lymphocytes Relative: 26.1 % (ref 12.0–46.0)
Monocytes Relative: 7.1 % (ref 3.0–12.0)
Neutro Abs: 3.9 10*3/uL (ref 1.4–7.7)
RBC: 4.73 Mil/uL (ref 4.22–5.81)
RDW: 12.5 % (ref 11.5–14.6)
WBC: 6 10*3/uL (ref 4.5–10.5)

## 2012-01-06 LAB — POCT URINALYSIS DIPSTICK
Blood, UA: NEGATIVE
Ketones, UA: NEGATIVE
Protein, UA: NEGATIVE
Spec Grav, UA: 1.01

## 2012-01-06 LAB — BASIC METABOLIC PANEL
Calcium: 9.7 mg/dL (ref 8.4–10.5)
GFR: 63.52 mL/min (ref >60.00)
Glucose, Bld: 113 mg/dL — ABNORMAL HIGH (ref 70–99)
Sodium: 136 mEq/L (ref 135–145)

## 2012-01-06 LAB — MICROALBUMIN / CREATININE URINE RATIO
Creatinine,U: 73.5 mg/dL
Microalb Creat Ratio: 1.1 mg/g (ref 0.0–30.0)
Microalb, Ur: 0.8 mg/dL (ref 0.0–1.9)

## 2012-01-06 LAB — PSA: PSA: 1.08 ng/mL (ref 0.10–4.00)

## 2012-01-06 LAB — HEPATIC FUNCTION PANEL
AST: 21 U/L (ref 0–37)
Albumin: 4.1 g/dL (ref 3.5–5.2)
Alkaline Phosphatase: 64 U/L (ref 39–117)

## 2012-01-06 LAB — LIPID PANEL
HDL: 41.6 mg/dL (ref >39.00)
Total CHOL/HDL Ratio: 4
VLDL: 31.4 mg/dL (ref 0.0–40.0)

## 2012-01-17 ENCOUNTER — Telehealth: Payer: Self-pay | Admitting: Family Medicine

## 2012-01-17 ENCOUNTER — Ambulatory Visit (INDEPENDENT_AMBULATORY_CARE_PROVIDER_SITE_OTHER): Payer: BC Managed Care – PPO | Admitting: Family Medicine

## 2012-01-17 ENCOUNTER — Encounter: Payer: Self-pay | Admitting: Family Medicine

## 2012-01-17 VITALS — BP 110/70 | HR 68 | Temp 98.6°F | Resp 12 | Wt 236.0 lb

## 2012-01-17 DIAGNOSIS — Z0000 Encounter for general adult medical examination without abnormal findings: Secondary | ICD-10-CM

## 2012-01-17 DIAGNOSIS — Z23 Encounter for immunization: Secondary | ICD-10-CM

## 2012-01-17 MED ORDER — LISINOPRIL 40 MG PO TABS: 40.0000 mg | ORAL_TABLET | Freq: Every day | ORAL | Status: DC

## 2012-01-17 MED ORDER — ZOSTER VACCINE LIVE 19400 UNT/0.65ML ~~LOC~~ SOLR: 0.6500 mL | Freq: Once | SUBCUTANEOUS | Status: AC

## 2012-01-17 MED ORDER — METOPROLOL SUCCINATE ER 50 MG PO TB24: 50.0000 mg | ORAL_TABLET | Freq: Every day | ORAL | Status: DC

## 2012-01-17 MED ORDER — HYDROCHLOROTHIAZIDE 25 MG PO TABS: 25.0000 mg | ORAL_TABLET | Freq: Every day | ORAL | Status: DC

## 2012-01-17 NOTE — Telephone Encounter (Signed)
noted 

## 2012-01-17 NOTE — Telephone Encounter (Signed)
Pt called and said that he would like to rcv the shingles vax, tdap and pneumonia vax, when pt come in for cpx today at 3pm. Pt said that his insurance will cover the shingles vax.

## 2012-01-17 NOTE — Patient Instructions (Signed)
Continue with weight loss efforts.  

## 2012-01-17 NOTE — Progress Notes (Signed)
  Subjective:    Patient ID: Jared Byrd, male    DOB: 1950-12-09, 61 y.o.   MRN: [id number]  HPI  Complete physical. Patient has history of obesity, type 2 diabetes, hypertension, dyslipidemia and metabolic syndrome. He has made some positive lifestyle changes and has lost some weight over past year. Blood sugars have improved. Current medications are HCTZ, aspirin, lisinopril, and metoprolol. Somewhat inconsistent exercise. No history of Pneumovax. No history of shingles vaccine. Last tetanus unknown. Is receiving regular colonoscopies with positive family history of colon cancer sister age 69. He'll be due for repeat colonoscopy next year. Nonsmoker.  Past Medical History  Diagnosis Date  . DIAB W/O MENTION COMP TYPE II/UNS TYPE UNCNTRL 02/03/2009  . METABOLIC SYNDROME X 02/03/2009  . HYPERTENSION 01/31/2009  . GERD 02/03/2009  . COLONIC POLYPS, HX OF 02/03/2009   No past surgical history on file.  reports that he quit smoking about 36 years ago. His smoking use included Cigarettes. He has a 5 pack-year smoking history. He does not have any smokeless tobacco history on file. His alcohol and drug histories not on file. family history includes Cancer in his mother and other; Cancer (age of onset:48) in his sister; Diabetes in his mother; Heart disease in his father and other; Hyperlipidemia in his other; and Hypertension in his father and other. No Known Allergies    Review of Systems  Constitutional: Negative for fever, activity change, appetite change and fatigue.  HENT: Negative for ear pain, congestion and trouble swallowing.   Eyes: Negative for pain and visual disturbance.  Respiratory: Negative for cough, shortness of breath and wheezing.   Cardiovascular: Negative for chest pain and palpitations.  Gastrointestinal: Negative for nausea, vomiting, abdominal pain, diarrhea, constipation, blood in stool, abdominal distention and rectal pain.  Genitourinary: Negative for dysuria,  hematuria and testicular pain.  Musculoskeletal: Negative for joint swelling and arthralgias.  Skin: Negative for rash.  Neurological: Negative for dizziness, syncope and headaches.  Hematological: Negative for adenopathy.  Psychiatric/Behavioral: Negative for confusion and dysphoric mood.       Objective:   Physical Exam  Constitutional: He is oriented to person, place, and time. He appears well-developed and well-nourished. No distress.  HENT:  Head: Normocephalic and atraumatic.  Right Ear: External ear normal.  Left Ear: External ear normal.  Mouth/Throat: Oropharynx is clear and moist.  Eyes: Conjunctivae and EOM are normal. Pupils are equal, round, and reactive to light.  Neck: Normal range of motion. Neck supple. No thyromegaly present.  Cardiovascular: Normal rate, regular rhythm and normal heart sounds.   No murmur heard. Pulmonary/Chest: No respiratory distress. He has no wheezes. He has no rales.  Abdominal: Soft. Bowel sounds are normal. He exhibits no distension and no mass. There is no tenderness. There is no rebound and no guarding.  Musculoskeletal: He exhibits no edema.  Lymphadenopathy:    He has no cervical adenopathy.  Neurological: He is alert and oriented to person, place, and time. He displays normal reflexes. No cranial nerve deficit.  Skin: No rash noted.  Psychiatric: He has a normal mood and affect.          Assessment & Plan:  Complete physical. Patient needs immunizations including Pneumovax, tetanus, and shingles vaccine. No contraindications. Labs reviewed with patient and overall improved compared to year ago. Needs to continue weight loss and more consistent exercise. Repeat colonoscopy in the next year.

## 2012-11-20 ENCOUNTER — Telehealth: Payer: Self-pay | Admitting: Family Medicine

## 2012-11-20 MED ORDER — INDOMETHACIN 50 MG PO CAPS: 50.0000 mg | ORAL_CAPSULE | Freq: Three times a day (TID) | ORAL | Status: DC

## 2012-11-20 NOTE — Telephone Encounter (Signed)
Patient Information:  Caller Name: Fannie Knee  Phone: [phone]  Patient: Jared Byrd, Jared Byrd  Gender: Male  DOB: March 07, 1951  Age: 62 Years  PCP: Evelena Peat [facility])  Office Follow Up:  Does the office need to follow up with this patient?: Yes  Instructions For The Office: OFFICE PLEASE FOLLOW UP WITH PT AND/OR CALLER REGARDING MEDICATION FOR GOUT TO BE CALLED IN.  RN Note:  Caller reports pt has had Gout before (and has used some of his brothers medication to help).  Caller states pt is due for physical in July 2014 and would prefer to not come in for an appt due to the deductible being 5500$.  Caller would like a prescription for GOUT [facility] [place].  Symptoms  Reason For Call & Symptoms: gout symptoms in right foot (toe is red on top and swollen)  Reviewed Health History In EMR: Yes  Reviewed Medications In EMR: Yes  Reviewed Allergies In EMR: Yes  Reviewed Surgeries / Procedures: Yes  Date of Onset of Symptoms: 11/19/2012  Guideline(s) Used:  Foot Pain  Disposition Per Guideline:   See Within 3 Days in Office  Reason For Disposition Reached:   Pain in the big toe joint  Advice Given:  N/A  Patient Refused Recommendation:  Patient Requests Prescription  caller is requesting a RX for GOUT [facility] [place]

## 2012-11-20 NOTE — Telephone Encounter (Signed)
Please advise for gout med

## 2012-11-20 NOTE — Telephone Encounter (Signed)
Can call in Indocin 50 mg po q 8 hours prn #30 with no refills but he really needs office visit to assess if not promptly improving.

## 2012-11-20 NOTE — Telephone Encounter (Signed)
Wife informed

## 2012-12-17 ENCOUNTER — Other Ambulatory Visit: Payer: Self-pay | Admitting: Family Medicine

## 2013-01-12 ENCOUNTER — Other Ambulatory Visit (INDEPENDENT_AMBULATORY_CARE_PROVIDER_SITE_OTHER): Payer: BC Managed Care – PPO

## 2013-01-12 DIAGNOSIS — Z0000 Encounter for general adult medical examination without abnormal findings: Secondary | ICD-10-CM

## 2013-01-12 LAB — CBC WITH DIFFERENTIAL/PLATELET
Eosinophils Relative: 1.5 % (ref 0.0–5.0)
HCT: 43.4 % (ref 39.0–52.0)
Lymphs Abs: 2 10*3/uL (ref 0.7–4.0)
MCV: 90.7 fl (ref 78.0–100.0)
Monocytes Absolute: 0.5 10*3/uL (ref 0.1–1.0)
Neutro Abs: 4.4 10*3/uL (ref 1.4–7.7)
Platelets: 259 10*3/uL (ref 150.0–400.0)
RDW: 13.3 % (ref 11.5–14.6)

## 2013-01-12 LAB — BASIC METABOLIC PANEL
BUN: 23 mg/dL (ref 6–23)
Chloride: 100 mEq/L (ref 96–112)
Glucose, Bld: 161 mg/dL — ABNORMAL HIGH (ref 70–99)
Potassium: 4.8 mEq/L (ref 3.5–5.1)

## 2013-01-12 LAB — HEPATIC FUNCTION PANEL
ALT: 28 U/L (ref 0–53)
AST: 23 U/L (ref 0–37)
Albumin: 4.2 g/dL (ref 3.5–5.2)

## 2013-01-12 LAB — TSH: TSH: 1.86 u[IU]/mL (ref 0.35–5.50)

## 2013-01-12 LAB — POCT URINALYSIS DIPSTICK
Blood, UA: NEGATIVE
Protein, UA: NEGATIVE
Spec Grav, UA: 1.01
Urobilinogen, UA: 0.2

## 2013-01-12 LAB — LIPID PANEL: Total CHOL/HDL Ratio: 5

## 2013-01-12 LAB — LDL CHOLESTEROL, DIRECT: Direct LDL: 145 mg/dL

## 2013-01-17 ENCOUNTER — Encounter: Payer: Self-pay | Admitting: Family Medicine

## 2013-01-17 ENCOUNTER — Ambulatory Visit (INDEPENDENT_AMBULATORY_CARE_PROVIDER_SITE_OTHER): Payer: BC Managed Care – PPO | Admitting: Family Medicine

## 2013-01-17 VITALS — BP 116/76 | HR 72 | Temp 98.3°F | Ht 73.0 in | Wt 238.0 lb

## 2013-01-17 DIAGNOSIS — M109 Gout, unspecified: Secondary | ICD-10-CM

## 2013-01-17 DIAGNOSIS — E785 Hyperlipidemia, unspecified: Secondary | ICD-10-CM

## 2013-01-17 DIAGNOSIS — IMO0001 Reserved for inherently not codable concepts without codable children: Secondary | ICD-10-CM

## 2013-01-17 DIAGNOSIS — Z0000 Encounter for general adult medical examination without abnormal findings: Secondary | ICD-10-CM

## 2013-01-17 LAB — HEMOGLOBIN A1C: Hgb A1c MFr Bld: 7.8 % — ABNORMAL HIGH (ref 4.6–6.5)

## 2013-01-17 MED ORDER — ATORVASTATIN CALCIUM 10 MG PO TABS: 10.0000 mg | ORAL_TABLET | Freq: Every day | ORAL | Status: DC

## 2013-01-17 NOTE — Patient Instructions (Addendum)
Lose some weight Reduce processed sugars and white starches. Establish more consistent exercise. Continue baby aspirin one daily.

## 2013-01-17 NOTE — Progress Notes (Signed)
Subjective:    Patient ID: Jared Byrd, male    DOB: 30-Aug-1950, 62 y.o.   MRN: [id number]  HPI Patient here for complete physical. He has history of metabolic syndrome, hypertension, dyslipidemia, type 2 diabetes currently untreated with medication, and history of GERD. History of benign colon polyps and due for repeat colonoscopy later this year.  Current medications reviewed include lisinopril, HCTZ, Toprol, and baby aspirin He has history of gout with and infrequent flareups. History metabolic syndrome. He had previous elevated blood sugars but never treated with medication. No recent symptoms of hyperglycemia. Poor compliance during the past year with exercise and diet.  Poor compliance recently with diet and exercise. Had some mild weight gain during the past year. Not monitoring blood sugars. Immunizations up to date.  Past Medical History  Diagnosis Date  . DIAB W/O MENTION COMP TYPE II/UNS TYPE UNCNTRL 02/03/2009  . METABOLIC SYNDROME X 02/03/2009  . HYPERTENSION 01/31/2009  . GERD 02/03/2009  . COLONIC POLYPS, HX OF 02/03/2009   No past surgical history on file.  reports that he quit smoking about 37 years ago. His smoking use included Cigarettes. He has a 5 pack-year smoking history. He does not have any smokeless tobacco history on file. His alcohol and drug histories are not on file. family history includes Cancer in his mother and other; Cancer (age of onset: 24) in his sister; Diabetes in his mother; Heart disease in his father and other; Hyperlipidemia in his other; Hypertension in his father and other; and Multiple endocrine neoplasia in his father. No Known Allergies    Review of Systems  Constitutional: Negative for fever, activity change, appetite change and fatigue.  HENT: Negative for ear pain, congestion and trouble swallowing.   Eyes: Negative for pain and visual disturbance.  Respiratory: Negative for cough, shortness of breath and wheezing.   Cardiovascular:  Negative for chest pain and palpitations.  Gastrointestinal: Negative for nausea, vomiting, abdominal pain, diarrhea, constipation, blood in stool, abdominal distention and rectal pain.  Genitourinary: Negative for dysuria, hematuria and testicular pain.  Musculoskeletal: Negative for joint swelling and arthralgias.  Skin: Negative for rash.  Neurological: Negative for dizziness, syncope and headaches.  Hematological: Negative for adenopathy.  Psychiatric/Behavioral: Negative for confusion and dysphoric mood.       Objective:   Physical Exam  Constitutional: He is oriented to person, place, and time. He appears well-developed and well-nourished. No distress.  HENT:  Head: Normocephalic and atraumatic.  Right Ear: External ear normal.  Left Ear: External ear normal.  Mouth/Throat: Oropharynx is clear and moist.  Eyes: Conjunctivae and EOM are normal. Pupils are equal, round, and reactive to light.  Neck: Normal range of motion. Neck supple. No thyromegaly present.  Cardiovascular: Normal rate, regular rhythm and normal heart sounds.   No murmur heard. Pulmonary/Chest: No respiratory distress. He has no wheezes. He has no rales.  Abdominal: Soft. Bowel sounds are normal. He exhibits no distension and no mass. There is no tenderness. There is no rebound and no guarding.  Musculoskeletal: He exhibits no edema.  Lymphadenopathy:    He has no cervical adenopathy.  Neurological: He is alert and oriented to person, place, and time. He displays normal reflexes. No cranial nerve deficit.  Skin: No rash noted.  Psychiatric: He has a normal mood and affect.          Assessment & Plan:  Complete physical. Continue baby aspirin. Patient needs to lose some weight establish more consistent exercise. Immunizations up to date.  We'll get repeat colonoscopy per GI later this year.  Type 2 diabetes. Currently untreated. Check A1c. Patient strongly prefers lifestyle modification and followup in 3  months versus medication such as metformin  Hyperlipidemia. LDL 145. We strongly advocated statin with Lipitor 10 mg daily and reassess lipids in 8-12 weeks

## 2013-01-18 ENCOUNTER — Telehealth: Payer: Self-pay | Admitting: Family Medicine

## 2013-01-18 NOTE — Telephone Encounter (Signed)
[facility] and stated [facility] (LIPITOR) 10 MG tablet RX that was sent yesterday. Please assist.

## 2013-01-18 NOTE — Telephone Encounter (Signed)
Called in the rx for the patient

## 2013-04-19 ENCOUNTER — Ambulatory Visit (INDEPENDENT_AMBULATORY_CARE_PROVIDER_SITE_OTHER): Payer: BC Managed Care – PPO | Admitting: Family Medicine

## 2013-04-19 ENCOUNTER — Encounter: Payer: Self-pay | Admitting: Family Medicine

## 2013-04-19 VITALS — BP 118/64 | HR 62 | Temp 98.1°F | Wt 229.0 lb

## 2013-04-19 DIAGNOSIS — E785 Hyperlipidemia, unspecified: Secondary | ICD-10-CM

## 2013-04-19 LAB — HEPATIC FUNCTION PANEL
ALT: 26 U/L (ref 0–53)
Alkaline Phosphatase: 55 U/L (ref 39–117)
Bilirubin, Direct: 0.2 mg/dL (ref 0.0–0.3)
Total Protein: 7.3 g/dL (ref 6.0–8.3)

## 2013-04-19 LAB — LIPID PANEL: Cholesterol: 134 mg/dL (ref 0–200)

## 2013-04-19 LAB — HEMOGLOBIN A1C: Hgb A1c MFr Bld: 6.9 % — ABNORMAL HIGH (ref 4.6–6.5)

## 2013-04-19 NOTE — Progress Notes (Signed)
  Subjective:    Patient ID: Jared Byrd, male    DOB: 09-23-1950, 62 y.o.   MRN: [id number]  HPI Followup regarding type 2 diabetes and dyslipidemia Patient has made some dietary changes with reduction of sugars and starches. He has lost about 10 pounds Recent A1c 7.8%. He does not monitor blood sugars. He started Lipitor for hyperlipidemia. Tolerating without side effects. No history of CAD or peripheral vascular disease. Hypertension is been well controlled with lisinopril, HCTZ, and metoprolol.  He plans to get flu vaccine through his work  Past Medical History  Diagnosis Date  . DIAB W/O MENTION COMP TYPE II/UNS TYPE UNCNTRL 02/03/2009  . METABOLIC SYNDROME X 02/03/2009  . HYPERTENSION 01/31/2009  . GERD 02/03/2009  . COLONIC POLYPS, HX OF 02/03/2009   No past surgical history on file.  reports that he quit smoking about 37 years ago. His smoking use included Cigarettes. He has a 5 pack-year smoking history. He does not have any smokeless tobacco history on file. His alcohol and drug histories are not on file. family history includes Cancer in his mother and other; Cancer (age of onset: 46) in his sister; Diabetes in his mother; Heart disease in his father and other; Hyperlipidemia in his other; Hypertension in his father and other; Multiple endocrine neoplasia in his father. No Known Allergies    Review of Systems  Constitutional: Negative for fatigue.  Eyes: Negative for visual disturbance.  Respiratory: Negative for cough, chest tightness and shortness of breath.   Cardiovascular: Negative for chest pain, palpitations and leg swelling.  Endocrine: Negative for polydipsia and polyuria.  Neurological: Negative for dizziness, syncope, weakness, light-headedness and headaches.       Objective:   Physical Exam  Constitutional: He appears well-developed and well-nourished.  Cardiovascular: Normal rate and regular rhythm.   Pulmonary/Chest: Effort normal and breath sounds  normal. No respiratory distress. He has no wheezes. He has no rales.  Musculoskeletal: He exhibits no edema.          Assessment & Plan:  #1 type 2 diabetes. Recent poor control. Hopefully improved with weight loss. Recheck A1c #2 hyperlipidemia. Check lipid and hepatic panel.

## 2013-04-19 NOTE — Patient Instructions (Signed)
Blood Sugar Monitoring, Adult  GLUCOSE METERS FOR SELF-MONITORING OF BLOOD GLUCOSE   It is important to be able to correctly measure your blood sugar (glucose). You can use a blood glucose monitor (a small battery-operated device) to check your glucose level at any time. This allows you and your caregiver to monitor your diabetes and to determine how well your treatment plan is working. The process of monitoring your blood glucose with a glucose meter is called self-monitoring of blood glucose (SMBG). When people with diabetes control their blood sugar, they have better health.  To test for glucose with a typical glucose meter, place the disposable strip in the meter. Then place a small sample of blood on the "test strip." The test strip is coated with chemicals that combine with glucose in blood. The meter measures how much glucose is present. The meter displays the glucose level as a number. Several new models can record and store a number of test results. Some models can connect to personal computers to store test results or print them out.   Newer meters are often easier to use than older models. Some meters allow you to get blood from places other than your fingertip. Some new models have automatic timing, error codes, signals, or barcode readers to help with proper adjustment (calibration). Some meters have a large display screen or spoken instructions for people with visual impairments.   INSTRUCTIONS FOR USING GLUCOSE METERS   Wash your hands with soap and warm water, or clean the area with alcohol. Dry your hands completely.   Prick the side of your fingertip with a lancet (a sharp-pointed tool used by hand).   Hold the hand down and gently milk the finger until a small drop of blood appears. Catch the blood with the test strip.    Follow the instructions for inserting the test strip and using the SMBG meter. Most meters require the meter to be turned on and the test strip to be inserted before applying the blood sample.   Record the test result.   Read the instructions carefully for both the meter and the test strips that go with it. Meter instructions are found in the user manual. Keep this manual to help you solve any problems that may arise. Many meters use "error codes" when there is a problem with the meter, the test strip, or the blood sample on the strip. You will need the manual to understand these error codes and fix the problem.   New devices are available such as laser lancets and meters that can test blood taken from "alternative sites" of the body, other than fingertips. However, you should use standard fingertip testing if your glucose changes rapidly. Also, use standard testing if:   You have eaten, exercised, or taken insulin in the past 2 hours.   You think your glucose is low.   You tend to not feel symptoms of low blood glucose (hypoglycemia).   You are ill or under stress.   Clean the meter as directed by the manufacturer.   Test the meter for accuracy as directed by the manufacturer.   Take your meter with you to your caregiver's office. This way, you can test your glucose in front of your caregiver to make sure you are using the meter correctly. Your caregiver can also take a sample of blood to test using a routine lab method. If values on the glucose meter are close to the lab results, you and your caregiver will see   that your meter is working well and you are using good technique. Your caregiver will advise you about what to do if the results do not match.  FREQUENCY OF TESTING    Your caregiver will tell you how often you should check your blood glucose. This will depend on your type of diabetes, your current level of diabetes control, and your types of medicines. The following are general guidelines, but your care plan may be different. Record all your readings and the time of day you took them for review with your caregiver.    Diabetes type 1.   When you are using insulin with good diabetic control (either multiple daily injections or via a pump), you should check your glucose 4 times a day.   If your diabetes is not well controlled, you may need to monitor more frequently, including before meals and 2 hours after meals, at bedtime, and occasionally between 2 a.m. and 3 a.m.   You should always check your glucose before a dose of insulin or before changing the rate on your insulin pump.   Diabetes type 2.   Guidelines for SMBG in diabetes type 2 are not as well defined.   If you are on insulin, follow the guidelines above.   If you are on medicines, but not insulin, and your glucose is not well controlled, you should test at least twice daily.   If you are not on insulin, and your diabetes is controlled with medicines or diet alone, you should test at least once daily, usually before breakfast.   A weekly profile will help your caregiver advise you on your care plan. The week before your visit, check your glucose before a meal and 2 hours after a meal at least daily. You may want to test before and after a different meal each day so you and your caregiver can tell how well controlled your blood sugars are throughout the course of a 24 hour period.   Gestational diabetes (diabetes during pregnancy).   Frequent testing is often necessary. Accurate timing is important.   If you are not on insulin, check your glucose 4 times a day. Check it before breakfast and 1 hour after the start of each meal.    If you are on insulin, check your glucose 6 times a day. Check it before each meal and 1 hour after the first bite of each meal.   General guidelines.   More frequent testing is required at the start of insulin treatment. Your caregiver will instruct you.   Test your glucose any time you suspect you have low blood sugar (hypoglycemia).   You should test more often when you change medicines, when you have unusual stress or illness, or in other unusual circumstances.  OTHER THINGS TO KNOW ABOUT GLUCOSE METERS   Measurement Range. Most glucose meters are able to read glucose levels over a broad range of values from as low as 0 to as high as 600 mg/dL. If you get an extremely high or low reading from your meter, you should first confirm it with another reading. Report very high or very low readings to your caregiver.   Whole Blood Glucose versus Plasma Glucose. Some older home glucose meters measure glucose in your whole blood. In a lab or when using some newer home glucose meters, the glucose is measured in your plasma (one component of blood). The difference can be important. It is important for you and your caregiver to know whether your meter   gives its results as "whole blood equivalent" or "plasma equivalent."   Display of High and Low Glucose Values. Part of learning how to operate a meter is understanding what the meter results mean. Know how high and low glucose concentrations are displayed on your meter.   Factors that Affect Glucose Meter Performance. The accuracy of your test results depends on many factors and varies depending on the brand and type of meter. These factors include:   Low red blood cell count (anemia).   Substances in your blood (such as uric acid, vitamin C, and others).   Environmental factors (temperature, humidity, altitude).   Name-brand versus generic test strips.    Calibration. Make sure your meter is set up properly. It is a good idea to do a calibration test with a control solution recommended by the manufacturer of your meter whenever you begin using a fresh bottle of test strips. This will help verify the accuracy of your meter.   Improperly stored, expired, or defective test strips. Keep your strips in a dry place with the lid on.   Soiled meter.   Inadequate blood sample.  NEW TECHNOLOGIES FOR GLUCOSE TESTING  Alternative site testing  Some glucose meters allow testing blood from alternative sites. These include the:   Upper arm.   Forearm.   Base of the thumb.   Thigh.  Sampling blood from alternative sites may be desirable. However, it may have some limitations. Blood in the fingertips show changes in glucose levels more quickly than blood in other parts of the body. This means that alternative site test results may be different from fingertip test results, not because of the meter's ability to test accurately, but because the actual glucose concentration can be different.   Continuous Glucose Monitoring  Devices to measure your blood glucose continuously are available, and others are in development. These methods can be more expensive than self-monitoring with a glucose meter. However, it is uncertain how effective and reliable these devices are. Your caregiver will advise you if this approach makes sense for you.  IF BLOOD SUGARS ARE CONTROLLED, PEOPLE WITH DIABETES REMAIN HEALTHIER.   SMBG is an important part of the treatment plan of patients with diabetes mellitus. Below are reasons for using SMBG:    It confirms that your glucose is at a specific, healthy level.   It detects hypoglycemia and severe hyperglycemia.   It allows you and your caregiver to make adjustments in response to changes in lifestyle for individuals requiring medicine.    It determines the need for starting insulin therapy in temporary diabetes that happens during pregnancy (gestational diabetes).  Document Released: 07/08/2003 Document Revised: 09/27/2011 Document Reviewed: 10/29/2010  [employer] Patient Information 2014 [employer].

## 2013-08-24 ENCOUNTER — Other Ambulatory Visit: Payer: Self-pay | Admitting: Family Medicine

## 2014-01-23 ENCOUNTER — Other Ambulatory Visit (INDEPENDENT_AMBULATORY_CARE_PROVIDER_SITE_OTHER): Payer: BC Managed Care – PPO

## 2014-01-23 DIAGNOSIS — Z0000 Encounter for general adult medical examination without abnormal findings: Secondary | ICD-10-CM

## 2014-01-23 LAB — CBC WITH DIFFERENTIAL/PLATELET
BASOS ABS: 0 10*3/uL (ref 0.0–0.1)
Basophils Relative: 0.2 % (ref 0.0–3.0)
Eosinophils Absolute: 0.1 10*3/uL (ref 0.0–0.7)
Eosinophils Relative: 1.5 % (ref 0.0–5.0)
HEMATOCRIT: 43.9 % (ref 39.0–52.0)
HEMOGLOBIN: 14.9 g/dL (ref 13.0–17.0)
LYMPHS ABS: 2.4 10*3/uL (ref 0.7–4.0)
Lymphocytes Relative: 31.5 % (ref 12.0–46.0)
MCHC: 33.9 g/dL (ref 30.0–36.0)
MCV: 89.9 fl (ref 78.0–100.0)
MONOS PCT: 6.6 % (ref 3.0–12.0)
Monocytes Absolute: 0.5 10*3/uL (ref 0.1–1.0)
Neutro Abs: 4.6 10*3/uL (ref 1.4–7.7)
Neutrophils Relative %: 60.2 % (ref 43.0–77.0)
Platelets: 262 10*3/uL (ref 150.0–400.0)
RBC: 4.88 Mil/uL (ref 4.22–5.81)
RDW: 12.6 % (ref 11.5–15.5)
WBC: 7.7 10*3/uL (ref 4.0–10.5)

## 2014-01-23 LAB — BASIC METABOLIC PANEL
BUN: 22 mg/dL (ref 6–23)
CALCIUM: 9.7 mg/dL (ref 8.4–10.5)
CO2: 28 meq/L (ref 19–32)
Chloride: 101 mEq/L (ref 96–112)
Creatinine, Ser: 1 mg/dL (ref 0.4–1.5)
GFR: 79.21 mL/min (ref >60.00)
GLUCOSE: 153 mg/dL — AB (ref 70–99)
Potassium: 4.3 mEq/L (ref 3.5–5.1)
Sodium: 135 mEq/L (ref 135–145)

## 2014-01-23 LAB — POCT URINALYSIS DIPSTICK
Bilirubin, UA: NEGATIVE
Blood, UA: NEGATIVE
GLUCOSE UA: NEGATIVE
Ketones, UA: NEGATIVE
Leukocytes, UA: NEGATIVE
NITRITE UA: NEGATIVE
Protein, UA: NEGATIVE
Spec Grav, UA: 1.01
UROBILINOGEN UA: 0.2
pH, UA: 6.5

## 2014-01-23 LAB — HEPATIC FUNCTION PANEL
ALT: 35 U/L (ref 0–53)
AST: 28 U/L (ref 0–37)
Albumin: 4.2 g/dL (ref 3.5–5.2)
Alkaline Phosphatase: 67 U/L (ref 39–117)
BILIRUBIN TOTAL: 1.1 mg/dL (ref 0.2–1.2)
Bilirubin, Direct: 0.2 mg/dL (ref 0.0–0.3)
Total Protein: 7.2 g/dL (ref 6.0–8.3)

## 2014-01-23 LAB — MICROALBUMIN / CREATININE URINE RATIO
CREATININE, U: 108 mg/dL
MICROALB UR: 0.2 mg/dL (ref 0.0–1.9)
Microalb Creat Ratio: 0.2 mg/g (ref 0.0–30.0)

## 2014-01-23 LAB — TSH: TSH: 2.13 u[IU]/mL (ref 0.35–4.50)

## 2014-01-23 LAB — PSA: PSA: 0.78 ng/mL (ref 0.10–4.00)

## 2014-01-23 LAB — LIPID PANEL
CHOL/HDL RATIO: 4
Cholesterol: 164 mg/dL (ref 0–200)
HDL: 46.7 mg/dL (ref >39.00)
LDL Cholesterol: 84 mg/dL (ref 0–99)
NONHDL: 117.3
TRIGLYCERIDES: 169 mg/dL — AB (ref 0.0–149.0)
VLDL: 33.8 mg/dL (ref 0.0–40.0)

## 2014-01-23 LAB — HEMOGLOBIN A1C: HEMOGLOBIN A1C: 7.8 % — AB (ref 4.6–6.5)

## 2014-01-28 ENCOUNTER — Other Ambulatory Visit: Payer: Self-pay | Admitting: Family Medicine

## 2014-01-30 ENCOUNTER — Ambulatory Visit (INDEPENDENT_AMBULATORY_CARE_PROVIDER_SITE_OTHER): Payer: BC Managed Care – PPO | Admitting: Family Medicine

## 2014-01-30 ENCOUNTER — Encounter: Payer: Self-pay | Admitting: Family Medicine

## 2014-01-30 VITALS — BP 120/80 | HR 68 | Temp 98.0°F | Ht 73.0 in | Wt 238.0 lb

## 2014-01-30 DIAGNOSIS — E1165 Type 2 diabetes mellitus with hyperglycemia: Secondary | ICD-10-CM

## 2014-01-30 DIAGNOSIS — IMO0001 Reserved for inherently not codable concepts without codable children: Secondary | ICD-10-CM

## 2014-01-30 DIAGNOSIS — Z0000 Encounter for general adult medical examination without abnormal findings: Secondary | ICD-10-CM

## 2014-01-30 DIAGNOSIS — E669 Obesity, unspecified: Secondary | ICD-10-CM | POA: Insufficient documentation

## 2014-01-30 NOTE — Patient Instructions (Signed)
Lose some weight and establish regular exercise and let's plan to repeat A1C in 3 months.

## 2014-01-30 NOTE — Progress Notes (Signed)
Subjective:    Patient ID: Jared Byrd, male    DOB: [birth year], 63 y.o.   MRN: [id number]  HPI Patient here for complete physical. He has history of type 2 diabetes, hypertension, hyperlipidemia, metabolic syndrome, gout, GERD. No recent gout flareups. Blood pressure stable. No chest pains. No dizziness. We started Lipitor year ago for hyperlipidemia. No side effects. History of elevated blood sugar but not checking blood sugars. He elected lifestyle management and reduced his A1c from 7.8 6.9. Unfortunately, A1c back up to 7.8 now. No consistent exercise.  Immunizations up-to-date. Repeat colonoscopy next month already scheduled. Quit smoking in his 69s Reviewed with no changes:  Past Medical History  Diagnosis Date  . DIAB W/O MENTION COMP TYPE II/UNS TYPE UNCNTRL 02/03/2009  . METABOLIC SYNDROME X 4/48/1856  . HYPERTENSION 01/31/2009  . GERD 02/03/2009  . COLONIC POLYPS, HX OF 02/03/2009   No past surgical history on file.  reports that he quit smoking about 38 years ago. His smoking use included Cigarettes. He has a 5 pack-year smoking history. He does not have any smokeless tobacco history on file. His alcohol and drug histories are not on file. family history includes Cancer in his mother and other; Cancer (age of onset: 50) in his sister; Diabetes in his mother; Heart disease in his father and other; Hyperlipidemia in his other; Hypertension in his father and other; Multiple endocrine neoplasia in his father. No Known Allergies    Review of Systems  Constitutional: Negative for fever, activity change, appetite change, fatigue and unexpected weight change.  HENT: Negative for congestion, ear pain and trouble swallowing.   Eyes: Negative for pain and visual disturbance.  Respiratory: Negative for cough, shortness of breath and wheezing.   Cardiovascular: Negative for chest pain and palpitations.  Gastrointestinal: Negative for nausea, vomiting, abdominal pain, diarrhea,  constipation, blood in stool, abdominal distention and rectal pain.  Endocrine: Negative for polydipsia and polyuria.  Genitourinary: Negative for dysuria, hematuria and testicular pain.  Musculoskeletal: Negative for arthralgias and joint swelling.  Skin: Negative for rash.  Neurological: Negative for dizziness, syncope and headaches.  Hematological: Negative for adenopathy.  Psychiatric/Behavioral: Negative for confusion and dysphoric mood.       Objective:   Physical Exam  Constitutional: He is oriented to person, place, and time. He appears well-developed and well-nourished. No distress.  HENT:  Head: Normocephalic and atraumatic.  Right Ear: External ear normal.  Left Ear: External ear normal.  Mouth/Throat: Oropharynx is clear and moist.  Eyes: Conjunctivae and EOM are normal. Pupils are equal, round, and reactive to light.  Neck: Normal range of motion. Neck supple. No thyromegaly present.  Cardiovascular: Normal rate, regular rhythm and normal heart sounds.   No murmur heard. Pulmonary/Chest: No respiratory distress. He has no wheezes. He has no rales.  Abdominal: Soft. Bowel sounds are normal. He exhibits no distension and no mass. There is no tenderness. There is no rebound and no guarding.  Musculoskeletal: He exhibits no edema.  Lymphadenopathy:    He has no cervical adenopathy.  Neurological: He is alert and oriented to person, place, and time. He displays normal reflexes. No cranial nerve deficit.  Skin: No rash noted.  Psychiatric: He has a normal mood and affect.          Assessment & Plan:  Complete physical. Immunizations up to date. Repeat colonoscopy scheduled. We discussed that he needs to lose some weight and recommended strategies. He has type 2 diabetes which is suboptimal control. We  discussed options. He prefers weight loss and reassess A1c in 3 months. If not to goal that point, start metformin. We offered certified diabetes educator he wishes to defer  at this time

## 2014-01-30 NOTE — Progress Notes (Signed)
Pre visit review using our clinic review tool, if applicable. No additional management support is needed unless otherwise documented below in the visit note. 

## 2014-02-12 ENCOUNTER — Telehealth: Payer: Self-pay | Admitting: Family Medicine

## 2014-02-12 MED ORDER — ATORVASTATIN CALCIUM 10 MG PO TABS: 10.0000 mg | ORAL_TABLET | Freq: Every day | ORAL | Status: DC

## 2014-02-12 NOTE — Telephone Encounter (Signed)
Pt needs new rxs atorvastatin 10 mg #90 w[facility] mg #14 [facility] [place].  pt call wife carolyn at [phone] once rxs has been sent in.

## 2014-02-12 NOTE — Telephone Encounter (Signed)
[facility]

## 2014-03-21 ENCOUNTER — Encounter: Payer: Self-pay | Admitting: Family Medicine

## 2014-05-02 ENCOUNTER — Other Ambulatory Visit (INDEPENDENT_AMBULATORY_CARE_PROVIDER_SITE_OTHER): Payer: BC Managed Care – PPO

## 2014-05-02 DIAGNOSIS — E1165 Type 2 diabetes mellitus with hyperglycemia: Secondary | ICD-10-CM

## 2014-05-02 DIAGNOSIS — IMO0002 Reserved for concepts with insufficient information to code with codable children: Secondary | ICD-10-CM

## 2014-05-02 LAB — HEMOGLOBIN A1C: HEMOGLOBIN A1C: 7.3 % — AB (ref 4.6–6.5)

## 2014-06-27 LAB — HM DIABETES EYE EXAM

## 2014-07-03 ENCOUNTER — Encounter: Payer: Self-pay | Admitting: Family Medicine

## 2014-07-09 ENCOUNTER — Other Ambulatory Visit: Payer: Self-pay | Admitting: *Deleted

## 2014-07-09 MED ORDER — HYDROCHLOROTHIAZIDE 25 MG PO TABS: 25.0000 mg | ORAL_TABLET | Freq: Every day | ORAL | Status: DC

## 2014-07-09 MED ORDER — LISINOPRIL 40 MG PO TABS: 40.0000 mg | ORAL_TABLET | Freq: Every day | ORAL | Status: DC

## 2014-07-09 MED ORDER — METOPROLOL SUCCINATE ER 50 MG PO TB24: 50.0000 mg | ORAL_TABLET | Freq: Every day | ORAL | Status: DC

## 2014-07-09 NOTE — Addendum Note (Signed)
Addended by: Townsend Roger D on: 07/09/2014 04:59 PM   Modules accepted: Orders

## 2014-07-15 ENCOUNTER — Ambulatory Visit (INDEPENDENT_AMBULATORY_CARE_PROVIDER_SITE_OTHER): Payer: BC Managed Care – PPO | Admitting: Family Medicine

## 2014-07-15 ENCOUNTER — Encounter: Payer: Self-pay | Admitting: Family Medicine

## 2014-07-15 VITALS — BP 126/78 | HR 70 | Temp 98.0°F | Wt 239.0 lb

## 2014-07-15 DIAGNOSIS — E1165 Type 2 diabetes mellitus with hyperglycemia: Secondary | ICD-10-CM

## 2014-07-15 DIAGNOSIS — IMO0002 Reserved for concepts with insufficient information to code with codable children: Secondary | ICD-10-CM

## 2014-07-15 NOTE — Progress Notes (Signed)
   Subjective:    Patient ID: Jared Byrd, male    DOB: [birth year], 63 y.o.   MRN: [id number]  HPI Follow-up type 2 diabetes. Patient has had basically no change in his overall weight since last visit. Last A1c 7.3% which is improved from 7.8%. Has been reluctant to start medication. symptoms of polyuria or polydipsia. Just had eye exam 2 weeks ago with no retinopathy changes noted. Blood pressure stable.  Past Medical History  Diagnosis Date  . DIAB W/O MENTION COMP TYPE II/UNS TYPE UNCNTRL 02/03/2009  . METABOLIC SYNDROME X 09/16/6008  . HYPERTENSION 01/31/2009  . GERD 02/03/2009  . COLONIC POLYPS, HX OF 02/03/2009   No past surgical history on file.  reports that he quit smoking about 38 years ago. His smoking use included Cigarettes. He has a 5 pack-year smoking history. He does not have any smokeless tobacco history on file. His alcohol and drug histories are not on file. family history includes Cancer in his mother and other; Cancer (age of onset: 66) in his sister; Diabetes in his mother; Heart disease in his father and other; Hyperlipidemia in his other; Hypertension in his father and other; Multiple endocrine neoplasia in his father. No Known Allergies    Review of Systems  Constitutional: Negative for fever, activity change and appetite change.  Respiratory: Negative for cough and shortness of breath.   Cardiovascular: Negative for chest pain and leg swelling.  Gastrointestinal: Negative for vomiting and abdominal pain.  Endocrine: Negative for polydipsia and polyuria.  Genitourinary: Negative for dysuria, hematuria and flank pain.  Musculoskeletal: Negative for joint swelling.  Neurological: Negative for weakness and numbness.       Objective:   Physical Exam  Constitutional: He appears well-developed and well-nourished.  Neck: Neck supple. No thyromegaly present.  Cardiovascular: Normal rate and regular rhythm.   Pulmonary/Chest: Effort normal and breath sounds normal.  No respiratory distress. He has no wheezes. He has no rales.  Musculoskeletal: He exhibits no edema.          Assessment & Plan:  Type 2 diabetes. Suboptimal control but improving. Continue weight loss efforts. We discussed diet and exercise. Establish more consistent exercise. Recheck A1c in about 3 months. If not further to goal that point will recommend starting metformin.

## 2014-07-15 NOTE — Patient Instructions (Signed)
Continue with weight loss efforts and establish more consistent exercise Let's plan to repeat A1C in about 2-3 months. Let me know if you are interested in home glucose monitor.

## 2014-07-15 NOTE — Progress Notes (Signed)
Pre visit review using our clinic review tool, if applicable. No additional management support is needed unless otherwise documented below in the visit note. 

## 2014-07-16 ENCOUNTER — Telehealth: Payer: Self-pay | Admitting: Family Medicine

## 2014-07-16 MED ORDER — GLUCOSE BLOOD VI STRP: ORAL_STRIP | Status: DC

## 2014-07-16 MED ORDER — ONETOUCH VERIO W/DEVICE KIT: 1.0000 | PACK | Freq: Every day | Status: DC

## 2014-07-16 MED ORDER — ONETOUCH VERIO IQ SYSTEM W/DEVICE KIT: PACK | Status: AC

## 2014-07-16 MED ORDER — ONETOUCH LANCETS MISC: Status: DC

## 2014-07-16 NOTE — Telephone Encounter (Signed)
Rx sent to pharmacy   

## 2014-07-16 NOTE — Telephone Encounter (Signed)
Pt needs one touch verio iq meter [facility] [place]. Pt needs test strips #100 one box and lancet #100. Please call meter into pharm today. Pt has met deductible

## 2014-09-19 ENCOUNTER — Other Ambulatory Visit (INDEPENDENT_AMBULATORY_CARE_PROVIDER_SITE_OTHER): Payer: BLUE CROSS/BLUE SHIELD

## 2014-09-19 DIAGNOSIS — IMO0002 Reserved for concepts with insufficient information to code with codable children: Secondary | ICD-10-CM

## 2014-09-19 DIAGNOSIS — E1165 Type 2 diabetes mellitus with hyperglycemia: Secondary | ICD-10-CM

## 2014-09-19 LAB — HEMOGLOBIN A1C: Hgb A1c MFr Bld: 8.8 % — ABNORMAL HIGH (ref 4.6–6.5)

## 2014-09-20 ENCOUNTER — Other Ambulatory Visit: Payer: Self-pay | Admitting: Family Medicine

## 2014-09-20 ENCOUNTER — Other Ambulatory Visit: Payer: Self-pay

## 2014-09-20 DIAGNOSIS — E1165 Type 2 diabetes mellitus with hyperglycemia: Secondary | ICD-10-CM

## 2014-09-20 DIAGNOSIS — IMO0002 Reserved for concepts with insufficient information to code with codable children: Secondary | ICD-10-CM

## 2014-09-20 MED ORDER — METFORMIN HCL 500 MG PO TABS: 500.0000 mg | ORAL_TABLET | Freq: Two times a day (BID) | ORAL | Status: DC

## 2014-10-02 ENCOUNTER — Other Ambulatory Visit: Payer: Self-pay | Admitting: Family Medicine

## 2014-11-26 ENCOUNTER — Other Ambulatory Visit: Payer: Self-pay | Admitting: Family Medicine

## 2015-01-02 ENCOUNTER — Telehealth: Payer: Self-pay | Admitting: Family Medicine

## 2015-01-02 MED ORDER — METFORMIN HCL 500 MG PO TABS: ORAL_TABLET | ORAL | Status: DC

## 2015-01-02 NOTE — Telephone Encounter (Signed)
Pt stated that he has about 4 or 5 more days left on his Metformin-diabetes meds and is req a rx refill.

## 2015-01-02 NOTE — Telephone Encounter (Signed)
Rx sent to local pharmacy.  

## 2015-01-24 ENCOUNTER — Other Ambulatory Visit (INDEPENDENT_AMBULATORY_CARE_PROVIDER_SITE_OTHER): Payer: BLUE CROSS/BLUE SHIELD

## 2015-01-24 DIAGNOSIS — Z0000 Encounter for general adult medical examination without abnormal findings: Secondary | ICD-10-CM

## 2015-01-24 DIAGNOSIS — E1165 Type 2 diabetes mellitus with hyperglycemia: Secondary | ICD-10-CM

## 2015-01-24 DIAGNOSIS — IMO0002 Reserved for concepts with insufficient information to code with codable children: Secondary | ICD-10-CM

## 2015-01-24 LAB — BASIC METABOLIC PANEL
BUN: 22 mg/dL (ref 6–23)
CALCIUM: 10.2 mg/dL (ref 8.4–10.5)
CO2: 30 mEq/L (ref 19–32)
CREATININE: 1.06 mg/dL (ref 0.40–1.50)
Chloride: 97 mEq/L (ref 96–112)
GFR: 74.67 mL/min (ref >60.00)
Glucose, Bld: 151 mg/dL — ABNORMAL HIGH (ref 70–99)
Potassium: 4.6 mEq/L (ref 3.5–5.1)
Sodium: 135 mEq/L (ref 135–145)

## 2015-01-24 LAB — CBC WITH DIFFERENTIAL/PLATELET
BASOS ABS: 0 10*3/uL (ref 0.0–0.1)
Basophils Relative: 0.2 % (ref 0.0–3.0)
Eosinophils Absolute: 0.2 10*3/uL (ref 0.0–0.7)
Eosinophils Relative: 3.1 % (ref 0.0–5.0)
HEMATOCRIT: 43.5 % (ref 39.0–52.0)
Hemoglobin: 14.8 g/dL (ref 13.0–17.0)
LYMPHS ABS: 1.9 10*3/uL (ref 0.7–4.0)
LYMPHS PCT: 27.5 % (ref 12.0–46.0)
MCHC: 34 g/dL (ref 30.0–36.0)
MCV: 89.1 fl (ref 78.0–100.0)
Monocytes Absolute: 0.5 10*3/uL (ref 0.1–1.0)
Monocytes Relative: 7.3 % (ref 3.0–12.0)
Neutro Abs: 4.4 10*3/uL (ref 1.4–7.7)
Neutrophils Relative %: 61.9 % (ref 43.0–77.0)
PLATELETS: 273 10*3/uL (ref 150.0–400.0)
RBC: 4.88 Mil/uL (ref 4.22–5.81)
RDW: 12.4 % (ref 11.5–15.5)
WBC: 7.1 10*3/uL (ref 4.0–10.5)

## 2015-01-24 LAB — HEPATIC FUNCTION PANEL
ALK PHOS: 66 U/L (ref 39–117)
ALT: 24 U/L (ref 0–53)
AST: 18 U/L (ref 0–37)
Albumin: 4.3 g/dL (ref 3.5–5.2)
BILIRUBIN DIRECT: 0.2 mg/dL (ref 0.0–0.3)
BILIRUBIN TOTAL: 1 mg/dL (ref 0.2–1.2)
Total Protein: 6.7 g/dL (ref 6.0–8.3)

## 2015-01-24 LAB — PSA: PSA: 0.76 ng/mL (ref 0.10–4.00)

## 2015-01-24 LAB — LIPID PANEL
CHOL/HDL RATIO: 3
Cholesterol: 129 mg/dL (ref 0–200)
HDL: 40.8 mg/dL (ref >39.00)
LDL CALC: 56 mg/dL (ref 0–99)
NonHDL: 88.2
Triglycerides: 159 mg/dL — ABNORMAL HIGH (ref 0.0–149.0)
VLDL: 31.8 mg/dL (ref 0.0–40.0)

## 2015-01-24 LAB — HEMOGLOBIN A1C: Hgb A1c MFr Bld: 7.1 % — ABNORMAL HIGH (ref 4.6–6.5)

## 2015-01-24 LAB — TSH: TSH: 1.21 u[IU]/mL (ref 0.35–4.50)

## 2015-01-24 LAB — MICROALBUMIN / CREATININE URINE RATIO
Creatinine,U: 73.4 mg/dL
Microalb Creat Ratio: 1 mg/g (ref 0.0–30.0)
Microalb, Ur: <0.7 mg/dL (ref 0.0–1.9)

## 2015-01-28 ENCOUNTER — Other Ambulatory Visit: Payer: Self-pay | Admitting: *Deleted

## 2015-01-28 MED ORDER — ATORVASTATIN CALCIUM 10 MG PO TABS: 10.0000 mg | ORAL_TABLET | Freq: Every day | ORAL | Status: DC

## 2015-01-28 NOTE — Telephone Encounter (Signed)
Rx done. 

## 2015-01-31 ENCOUNTER — Encounter: Payer: Self-pay | Admitting: Family Medicine

## 2015-01-31 ENCOUNTER — Ambulatory Visit (INDEPENDENT_AMBULATORY_CARE_PROVIDER_SITE_OTHER): Payer: BLUE CROSS/BLUE SHIELD | Admitting: Family Medicine

## 2015-01-31 VITALS — BP 104/74 | HR 73 | Temp 98.3°F | Ht 73.5 in | Wt 232.6 lb

## 2015-01-31 DIAGNOSIS — IMO0002 Reserved for concepts with insufficient information to code with codable children: Secondary | ICD-10-CM

## 2015-01-31 DIAGNOSIS — E1165 Type 2 diabetes mellitus with hyperglycemia: Secondary | ICD-10-CM

## 2015-01-31 DIAGNOSIS — Z0000 Encounter for general adult medical examination without abnormal findings: Secondary | ICD-10-CM | POA: Diagnosis not present

## 2015-01-31 LAB — HM DIABETES FOOT EXAM: HM DIABETIC FOOT EXAM: NORMAL

## 2015-01-31 MED ORDER — METFORMIN HCL 500 MG PO TABS: ORAL_TABLET | ORAL | Status: DC

## 2015-01-31 NOTE — Progress Notes (Signed)
   Subjective:    Patient ID: Jared Byrd, male    DOB: 1950-08-19, 64 y.o.   MRN: [id number]  HPI Patient seen for complete physical. He has chronic problems of obesity, type 2 diabetes, hyperlipidemia, gout, metabolic syndrome. Recent poor control diabetes and we added metformin and blood sugars have improved greatly. He has reduced sugar and starch intake. His immunizations are up to date. Colonoscopy up-to-date. Does not exercise consistently  Past Medical History  Diagnosis Date  . DIAB W/O MENTION COMP TYPE II/UNS TYPE UNCNTRL 02/03/2009  . METABOLIC SYNDROME X 7/91/5056  . HYPERTENSION 01/31/2009  . GERD 02/03/2009  . COLONIC POLYPS, HX OF 02/03/2009   No past surgical history on file.  reports that he quit smoking about 39 years ago. His smoking use included Cigarettes. He has a 5 pack-year smoking history. He does not have any smokeless tobacco history on file. His alcohol and drug histories are not on file. family history includes Cancer in his mother and other; Cancer (age of onset: 62) in his sister; Diabetes in his mother; Heart disease in his father and other; Hyperlipidemia in his other; Hypertension in his father and other; Multiple endocrine neoplasia in his father. No Known Allergies    Review of Systems  Constitutional: Negative for fever, activity change, appetite change and fatigue.  HENT: Negative for congestion, ear pain and trouble swallowing.   Eyes: Negative for pain and visual disturbance.  Respiratory: Negative for cough, shortness of breath and wheezing.   Cardiovascular: Negative for chest pain and palpitations.  Gastrointestinal: Negative for nausea, vomiting, abdominal pain, diarrhea, constipation, blood in stool, abdominal distention and rectal pain.  Genitourinary: Negative for dysuria, hematuria and testicular pain.  Musculoskeletal: Negative for joint swelling and arthralgias.  Skin: Negative for rash.  Neurological: Negative for dizziness, syncope and  headaches.  Hematological: Negative for adenopathy.  Psychiatric/Behavioral: Negative for confusion and dysphoric mood.       Objective:   Physical Exam  Constitutional: He is oriented to person, place, and time. He appears well-developed and well-nourished. No distress.  HENT:  Head: Normocephalic and atraumatic.  Right Ear: External ear normal.  Left Ear: External ear normal.  Mouth/Throat: Oropharynx is clear and moist.  Eyes: Conjunctivae and EOM are normal. Pupils are equal, round, and reactive to light.  Neck: Normal range of motion. Neck supple. No thyromegaly present.  Cardiovascular: Normal rate, regular rhythm and normal heart sounds.   No murmur heard. Pulmonary/Chest: No respiratory distress. He has no wheezes. He has no rales.  Abdominal: Soft. Bowel sounds are normal. He exhibits no distension and no mass. There is no tenderness. There is no rebound and no guarding.  Musculoskeletal: He exhibits no edema.  Lymphadenopathy:    He has no cervical adenopathy.  Neurological: He is alert and oriented to person, place, and time. He displays normal reflexes. No cranial nerve deficit.  Skin: No rash noted.  Psychiatric: He has a normal mood and affect.          Assessment & Plan:  Complete physical. Labs reviewed. A1c improved to 7.1%. He is encouraged to establish more consistent exercise. Lose some weight. Reassess A1c within 6 months

## 2015-01-31 NOTE — Progress Notes (Signed)
Pre visit review using our clinic review tool, if applicable. No additional management support is needed unless otherwise documented below in the visit note. 

## 2015-03-30 ENCOUNTER — Other Ambulatory Visit: Payer: Self-pay | Admitting: Family Medicine

## 2015-04-02 ENCOUNTER — Other Ambulatory Visit: Payer: Self-pay | Admitting: Family Medicine

## 2015-04-28 ENCOUNTER — Other Ambulatory Visit: Payer: Self-pay | Admitting: Family Medicine

## 2015-05-13 ENCOUNTER — Telehealth: Payer: Self-pay | Admitting: Family Medicine

## 2015-05-13 NOTE — Telephone Encounter (Signed)
Wife said the patient has Gout in his feet and they need a prescription to stop it before it gets worse.

## 2015-05-14 MED ORDER — INDOMETHACIN 50 MG PO CAPS: 50.0000 mg | ORAL_CAPSULE | Freq: Three times a day (TID) | ORAL | Status: DC | PRN

## 2015-05-14 NOTE — Telephone Encounter (Signed)
Recommend follow up to assess.  Acute gout in both feet simultaneously would be unusual

## 2015-05-14 NOTE — Telephone Encounter (Signed)
Patient's wife said the gout is only in his right foot, and the doctor has seen him for this before.  They were just looking for two prescriptions: 1) one for the pain right now  2) one for the future to prevent it when it starts hurting.  Patient's wife said the patient is out of town right now, and it costs them $225 every time they come in to see the doctor.

## 2015-05-14 NOTE — Telephone Encounter (Signed)
Called and left a detailed message on voice mail asking patient to call for an appt so his problem can be assessed.

## 2015-05-14 NOTE — Telephone Encounter (Signed)
[facility]

## 2015-05-14 NOTE — Telephone Encounter (Signed)
May call in Indocin 50 mg one po tid prn gout #30 We CANNOT start preventative during acute flare or this could worsen his acute gout attack.  When he gets back in town (or with next office visit) we can discuss preventive medications.

## 2015-05-19 ENCOUNTER — Telehealth: Payer: Self-pay | Admitting: Family Medicine

## 2015-05-19 MED ORDER — METFORMIN HCL 500 MG PO TABS: ORAL_TABLET | ORAL | Status: DC

## 2015-05-19 NOTE — Telephone Encounter (Signed)
Rx was sent to both pharmacy

## 2015-05-19 NOTE — Telephone Encounter (Signed)
Pt request refill metFORMIN (GLUCOPHAGE) 500 MG tablet  Pt did not realize his rx was out of refills and   [facility] [place]  [facility]  Pt has fpllow up appt in Jan

## 2015-07-07 LAB — HM DIABETES EYE EXAM

## 2015-07-16 ENCOUNTER — Encounter: Payer: Self-pay | Admitting: Family Medicine

## 2015-08-04 ENCOUNTER — Ambulatory Visit (INDEPENDENT_AMBULATORY_CARE_PROVIDER_SITE_OTHER): Payer: BLUE CROSS/BLUE SHIELD | Admitting: Family Medicine

## 2015-08-04 ENCOUNTER — Encounter: Payer: Self-pay | Admitting: Family Medicine

## 2015-08-04 VITALS — BP 118/82 | HR 80 | Temp 98.5°F | Ht 73.5 in | Wt 240.9 lb

## 2015-08-04 DIAGNOSIS — E1165 Type 2 diabetes mellitus with hyperglycemia: Secondary | ICD-10-CM

## 2015-08-04 DIAGNOSIS — I10 Essential (primary) hypertension: Secondary | ICD-10-CM

## 2015-08-04 DIAGNOSIS — IMO0001 Reserved for inherently not codable concepts without codable children: Secondary | ICD-10-CM

## 2015-08-04 LAB — HEMOGLOBIN A1C: Hgb A1c MFr Bld: 8 % — ABNORMAL HIGH (ref 4.6–6.5)

## 2015-08-04 NOTE — Progress Notes (Signed)
   Subjective:    Patient ID: Jared Byrd, male    DOB: [birth year], 65 y.o.   MRN: [id number]  HPI Follow-up type 2 diabetes  Patient was started on metformin last year. Hemoglobin A1c improved from 8.8% to 7.1%. He does not monitor his blood sugars. No polyuria or polydipsia. Recent eye exam unremarkable  He also has hyperlipidemia and hypertension. Medications reviewed. No side effects. Compliant with all. His weight has increased about 8 pounds from last summer. He treats this to poor eating over the holidays. Also less walking for exercise.  Past Medical History  Diagnosis Date  . DIAB W/O MENTION COMP TYPE II/UNS TYPE UNCNTRL 02/03/2009  . METABOLIC SYNDROME X 0000000  . HYPERTENSION 01/31/2009  . GERD 02/03/2009  . COLONIC POLYPS, HX OF 02/03/2009   No past surgical history on file.  reports that he quit smoking about 39 years ago. His smoking use included Cigarettes. He has a 5 pack-year smoking history. He does not have any smokeless tobacco history on file. His alcohol and drug histories are not on file. family history includes Cancer in his mother and other; Cancer (age of onset: 71) in his sister; Diabetes in his mother; Heart disease in his father and other; Hyperlipidemia in his other; Hypertension in his father and other; Multiple endocrine neoplasia in his father. No Known Allergies    Review of Systems  Constitutional: Negative for fatigue.  Eyes: Negative for visual disturbance.  Respiratory: Negative for cough, chest tightness and shortness of breath.   Cardiovascular: Negative for chest pain, palpitations and leg swelling.  Endocrine: Negative for polydipsia and polyuria.  Neurological: Negative for dizziness, syncope, weakness, light-headedness and headaches.       Objective:   Physical Exam  Constitutional: He is oriented to person, place, and time. He appears well-developed and well-nourished.  HENT:  Right Ear: External ear normal.  Left Ear: External  ear normal.  Mouth/Throat: Oropharynx is clear and moist.  Eyes: Pupils are equal, round, and reactive to light.  Neck: Neck supple. No thyromegaly present.  Cardiovascular: Normal rate and regular rhythm.   Pulmonary/Chest: Effort normal and breath sounds normal. No respiratory distress. He has no wheezes. He has no rales.  Musculoskeletal: He exhibits no edema.  Neurological: He is alert and oriented to person, place, and time.          Assessment & Plan:  Type 2 diabetes. History of fair control. Recheck A1c. He is encouraged to lose some weight and establish more consistent exercise. We'll plan routine follow-up in 6 months and sooner if A1c comes back high

## 2015-08-04 NOTE — Progress Notes (Signed)
Pre visit review using our clinic review tool, if applicable. No additional management support is needed unless otherwise documented below in the visit note. 

## 2015-08-08 ENCOUNTER — Telehealth: Payer: Self-pay | Admitting: Family Medicine

## 2015-08-08 MED ORDER — METFORMIN HCL 500 MG PO TABS: ORAL_TABLET | ORAL | Status: DC

## 2015-08-08 NOTE — Telephone Encounter (Signed)
Pt needs new rx of metformin  500 mg 2 pills twice a day #360 for [facility]

## 2015-08-08 NOTE — Telephone Encounter (Signed)
Medication refilled for patient. 

## 2015-11-04 ENCOUNTER — Other Ambulatory Visit (INDEPENDENT_AMBULATORY_CARE_PROVIDER_SITE_OTHER): Payer: BLUE CROSS/BLUE SHIELD

## 2015-11-04 DIAGNOSIS — IMO0002 Reserved for concepts with insufficient information to code with codable children: Secondary | ICD-10-CM

## 2015-11-04 DIAGNOSIS — E1165 Type 2 diabetes mellitus with hyperglycemia: Secondary | ICD-10-CM | POA: Diagnosis not present

## 2015-11-04 DIAGNOSIS — IMO0001 Reserved for inherently not codable concepts without codable children: Secondary | ICD-10-CM

## 2015-11-04 LAB — HEMOGLOBIN A1C: HEMOGLOBIN A1C: 7.8 % — AB (ref 4.6–6.5)

## 2015-12-28 ENCOUNTER — Other Ambulatory Visit: Payer: Self-pay | Admitting: Family Medicine

## 2016-02-09 ENCOUNTER — Encounter: Payer: Medicare Other | Admitting: Family Medicine

## 2016-02-10 ENCOUNTER — Ambulatory Visit (INDEPENDENT_AMBULATORY_CARE_PROVIDER_SITE_OTHER)
Admission: RE | Admit: 2016-02-10 | Discharge: 2016-02-10 | Disposition: A | Discharge status: Home / Self Care | Payer: BLUE CROSS/BLUE SHIELD | Source: Ambulatory Visit | Attending: Family Medicine | Admitting: Family Medicine

## 2016-02-10 ENCOUNTER — Encounter: Payer: Self-pay | Admitting: Family Medicine

## 2016-02-10 ENCOUNTER — Ambulatory Visit (INDEPENDENT_AMBULATORY_CARE_PROVIDER_SITE_OTHER): Payer: BLUE CROSS/BLUE SHIELD | Admitting: Family Medicine

## 2016-02-10 VITALS — BP 126/84 | HR 63 | Temp 98.2°F | Ht 73.5 in

## 2016-02-10 DIAGNOSIS — M25551 Pain in right hip: Secondary | ICD-10-CM | POA: Diagnosis not present

## 2016-02-10 MED ORDER — NAPROXEN 500 MG PO TABS: 500.0000 mg | ORAL_TABLET | Freq: Two times a day (BID) | ORAL | 0 refills | Status: DC

## 2016-02-10 NOTE — Patient Instructions (Signed)
BEFORE YOU LEAVE: -follow up: with your doctor in 1-2 weeks -xray sheet -greater trochanter exercises  Go get the xray you requested.  Do not use any other antiinflammatory medications or over the counter pain medications with the naproxen. Can use as instructed for pain as needed.  Ice   Follow up sooner if worsening or new symptoms arise

## 2016-02-10 NOTE — Progress Notes (Signed)
HPI:  Jared Byrd is a pleasant 65 year old patient of Dr. Elease Hashimoto here for an acute visit for right hip pain. The pain started yesterday. However, he reports intermittent flares of pain in the right lateral hip for 2-3 years. The pain is sharp, severe at times, located in the right lateral hip, and is worse with certain movements and lying on this hip. He has tried Advil and Tylenol for the pain with minimal reduction in symptoms. Denies trauma, falls, weakness, numbness, back pain, radiation of pain, buttock pain, groin pain, fevers, malaise or any other symptoms. His wife is extremely concerned. She reports she was told that they can get an x-ray today. She wants to make sure this done today.  ROS: See pertinent positives and negatives per HPI.  Past Medical History:  Diagnosis Date  . COLONIC POLYPS, HX OF 02/03/2009  . DIAB W/O MENTION COMP TYPE II/UNS TYPE UNCNTRL 02/03/2009  . GERD 02/03/2009  . HYPERTENSION 01/31/2009  . METABOLIC SYNDROME X 03/20/4096    No past surgical history on file.  Family History  Problem Relation Age of Onset  . Cancer Mother     breast  . Diabetes Mother   . Hyperlipidemia Other   . Hypertension Other   . Heart disease Other   . Cancer Other     parent  . Cancer Sister 7    colon cancer  . Hypertension Father   . Heart disease Father   . Multiple endocrine neoplasia Father     Social History   Social History  . Marital status: Married    Spouse name: N/A  . Number of children: N/A  . Years of education: N/A   Social History Main Topics  . Smoking status: Former Smoker    Packs/day: 1.00    Years: 5.00    Types: Cigarettes    Quit date: 01/13/1976  . Smokeless tobacco: None  . Alcohol use None  . Drug use: Unknown  . Sexual activity: Not Asked   Other Topics Concern  . None   Social History Narrative  . None     Current Outpatient Prescriptions:  .  aspirin 81 MG tablet, Take 81 mg by mouth daily.  , Disp: , Rfl:  .   atorvastatin (LIPITOR) 10 MG tablet, TAKE 1 TABLET DAILY, Disp: 90 tablet, Rfl: 3 .  Blood Glucose Monitoring Suppl (ONETOUCH VERIO IQ SYSTEM) W/DEVICE KIT, Use as instructed., Disp: 1 kit, Rfl: 0 .  glucose blood (ONETOUCH VERIO) test strip, Check 1 time daily. E11.9, Disp: 100 each, Rfl: 3 .  hydrochlorothiazide (HYDRODIURIL) 25 MG tablet, TAKE 1 TABLET DAILY, Disp: 90 tablet, Rfl: 0 .  indomethacin (INDOCIN) 50 MG capsule, Take 1 capsule (50 mg total) by mouth 3 (three) times daily as needed., Disp: 30 capsule, Rfl: 0 .  lisinopril (PRINIVIL,ZESTRIL) 40 MG tablet, TAKE 1 TABLET DAILY, Disp: 90 tablet, Rfl: 0 .  metFORMIN (GLUCOPHAGE) 500 MG tablet, TAKE 2 TABLETS BY MOUTH TWICE A DAY WITH A MEAL, Disp: 360 tablet, Rfl: 3 .  metoprolol succinate (TOPROL-XL) 50 MG 24 hr tablet, TAKE 1 TABLET DAILY WITH OR IMMEDIATELY FOLLOWING A MEAL, Disp: 90 tablet, Rfl: 0 .  ONE TOUCH LANCETS MISC, Verio Lancets. Check one time daily. E11.9, Disp: 100 each, Rfl: 3 .  naproxen (NAPROSYN) 500 MG tablet, Take 1 tablet (500 mg total) by mouth 2 (two) times daily with a meal., Disp: 14 tablet, Rfl: 0  Current Facility-Administered Medications:  .  zoster vaccine live (  PF) (ZOSTAVAX) injection 19,400 Units, 0.65 mL, Subcutaneous, Once, Eulas Post, MD  EXAM:  Vitals:   02/10/16 1609  BP: 126/84  Pulse: 63  Temp: 98.2 F (36.8 C)    There is no height or weight on file to calculate BMI.  GENERAL: vitals reviewed and listed above, alert, oriented, appears well hydrated and in no acute distress  HEENT: atraumatic, conjunttiva clear, no obvious abnormalities on inspection of external nose and ears  NECK: no obvious masses on inspection  LUNGS: clear to auscultation bilaterally, no wheezes, rales or rhonchi, good air movement  CV: HRRR, no peripheral edema  MS: moves all extremities without noticeable abnormality; normal inspection of the hip,TTP over the R lateral hip/greater trochanter, no other  bony TTP, negative Trendelenburg, normal sensitivity to light touch and strength throughout in the lower extremities bilaterally, normal int/ext rot hip, negative straight leg raising test, negative cross leg raising test, negative FABER/FADIR, neg hip compression test, and no TTP over the hip sockets  PSYCH: pleasant and cooperative, no obvious depression or anxiety  ASSESSMENT AND PLAN:  Discussed the following assessment and plan:  Lateral pain of right hip - Plan: DG HIP UNILAT WITH PELVIS 2-3 VIEWS RIGHT  -suspect bursitis or IT band pathology -discussed tx options and decided to start with ice, naproxen, HEP after discussion various options and riks/sbenefits -wife want xray and opted to do given severity of pain after discussion risks -follow up with PCP in 1-2 weeks, sooner if needed -wife inquired about speciality care - provided info on ortho urgent care options -Patient advised to return or notify a doctor immediately if symptoms worsen or persist or new concerns arise.  Patient Instructions  BEFORE YOU LEAVE: -follow up: with your doctor in 1-2 weeks -xray sheet -greater trochanter exercises  Go get the xray you requested.  Do not use any other antiinflammatory medications or over the counter pain medications with the naproxen. Can use as instructed for pain as needed.  Ice   Follow up sooner if worsening or new symptoms arise   Colin Benton R., DO

## 2016-02-10 NOTE — Progress Notes (Signed)
Pre visit review using our clinic review tool, if applicable. No additional management support is needed unless otherwise documented below in the visit note. 

## 2016-02-11 ENCOUNTER — Ambulatory Visit: Payer: Medicare Other | Admitting: Family Medicine

## 2016-02-12 ENCOUNTER — Telehealth: Payer: Self-pay | Admitting: Family Medicine

## 2016-02-12 MED ORDER — TRAMADOL HCL 50 MG PO TABS: 50.0000 mg | ORAL_TABLET | Freq: Two times a day (BID) | ORAL | 0 refills | Status: DC | PRN

## 2016-02-12 NOTE — Addendum Note (Signed)
Addended by: Agnes Lawrence on: 02/12/2016 02:52 PM   Modules accepted: Orders

## 2016-02-12 NOTE — Telephone Encounter (Signed)
I called the pt and informed him of the results.  He stated Naproxen has not helped with the pain and he cannot sleep at night.  Message forwarded to Dr Maudie Mercury.

## 2016-02-12 NOTE — Telephone Encounter (Signed)
Pts wife would like to have something for hip pain the Rx that was given is not helping.  And would like to have the xray results from Tuesday.  [facility]arm:  [facility] [place]

## 2016-02-12 NOTE — Telephone Encounter (Signed)
I called the pt and informed him of the message below and he is aware the Rx was sent (via fax) to his pharmacy.  States he has an appt with Dr Elease Hashimoto scheduled.

## 2016-02-12 NOTE — Telephone Encounter (Signed)
Please call pt. Sorry to hear. Ensure they have follow up with PCP next week. Ok to rx tramadol 50, #10 only to use every 12 hours prn pain. Advise they discuss risks with pharmacist. If severe or worsening symptoms, new symtoms or this is not working for pain advise the ortho walk in clinics we discussed.

## 2016-02-16 ENCOUNTER — Telehealth: Payer: Self-pay | Admitting: Emergency Medicine

## 2016-02-16 NOTE — Telephone Encounter (Deleted)
Pt needs a copy of his x-rays and will come by to pick up. Please follow up thanks.

## 2016-02-23 ENCOUNTER — Encounter: Payer: Self-pay | Admitting: Family Medicine

## 2016-02-23 ENCOUNTER — Ambulatory Visit (INDEPENDENT_AMBULATORY_CARE_PROVIDER_SITE_OTHER): Payer: BLUE CROSS/BLUE SHIELD | Admitting: Family Medicine

## 2016-02-23 VITALS — BP 120/80 | HR 77 | Temp 98.1°F | Ht 73.5 in | Wt 230.4 lb

## 2016-02-23 DIAGNOSIS — Z23 Encounter for immunization: Secondary | ICD-10-CM

## 2016-02-23 DIAGNOSIS — IMO0002 Reserved for concepts with insufficient information to code with codable children: Secondary | ICD-10-CM

## 2016-02-23 DIAGNOSIS — E1165 Type 2 diabetes mellitus with hyperglycemia: Secondary | ICD-10-CM

## 2016-02-23 DIAGNOSIS — Z0000 Encounter for general adult medical examination without abnormal findings: Secondary | ICD-10-CM | POA: Diagnosis not present

## 2016-02-23 DIAGNOSIS — E1142 Type 2 diabetes mellitus with diabetic polyneuropathy: Secondary | ICD-10-CM

## 2016-02-23 LAB — LIPID PANEL
CHOLESTEROL: 120 mg/dL (ref 0–200)
HDL: 43.9 mg/dL (ref >39.00)
LDL Cholesterol: 47 mg/dL (ref 0–99)
NonHDL: 76.38
TRIGLYCERIDES: 147 mg/dL (ref 0.0–149.0)
Total CHOL/HDL Ratio: 3
VLDL: 29.4 mg/dL (ref 0.0–40.0)

## 2016-02-23 LAB — HEPATIC FUNCTION PANEL
ALK PHOS: 59 U/L (ref 39–117)
ALT: 26 U/L (ref 0–53)
AST: 18 U/L (ref 0–37)
Albumin: 4.6 g/dL (ref 3.5–5.2)
BILIRUBIN DIRECT: 0.2 mg/dL (ref 0.0–0.3)
Total Bilirubin: 1 mg/dL (ref 0.2–1.2)
Total Protein: 7.1 g/dL (ref 6.0–8.3)

## 2016-02-23 LAB — BASIC METABOLIC PANEL
BUN: 17 mg/dL (ref 6–23)
CALCIUM: 10.2 mg/dL (ref 8.4–10.5)
CHLORIDE: 98 meq/L (ref 96–112)
CO2: 29 mEq/L (ref 19–32)
CREATININE: 0.99 mg/dL (ref 0.40–1.50)
GFR: 80.53 mL/min (ref >60.00)
Glucose, Bld: 125 mg/dL — ABNORMAL HIGH (ref 70–99)
Potassium: 4.6 mEq/L (ref 3.5–5.1)
Sodium: 135 mEq/L (ref 135–145)

## 2016-02-23 LAB — CBC WITH DIFFERENTIAL/PLATELET
BASOS PCT: 0.3 % (ref 0.0–3.0)
Basophils Absolute: 0 10*3/uL (ref 0.0–0.1)
EOS PCT: 2.9 % (ref 0.0–5.0)
Eosinophils Absolute: 0.2 10*3/uL (ref 0.0–0.7)
HEMATOCRIT: 43.5 % (ref 39.0–52.0)
Hemoglobin: 14.8 g/dL (ref 13.0–17.0)
LYMPHS ABS: 1.9 10*3/uL (ref 0.7–4.0)
LYMPHS PCT: 22.7 % (ref 12.0–46.0)
MCHC: 34 g/dL (ref 30.0–36.0)
MCV: 89.5 fl (ref 78.0–100.0)
MONOS PCT: 6 % (ref 3.0–12.0)
Monocytes Absolute: 0.5 10*3/uL (ref 0.1–1.0)
NEUTROS ABS: 5.6 10*3/uL (ref 1.4–7.7)
NEUTROS PCT: 68.1 % (ref 43.0–77.0)
PLATELETS: 306 10*3/uL (ref 150.0–400.0)
RBC: 4.86 Mil/uL (ref 4.22–5.81)
RDW: 12.5 % (ref 11.5–15.5)
WBC: 8.3 10*3/uL (ref 4.0–10.5)

## 2016-02-23 LAB — PSA: PSA: 1.26 ng/mL (ref 0.10–4.00)

## 2016-02-23 LAB — TSH: TSH: 1.33 u[IU]/mL (ref 0.35–4.50)

## 2016-02-23 LAB — HM DIABETES FOOT EXAM

## 2016-02-23 LAB — HEMOGLOBIN A1C: Hgb A1c MFr Bld: 7 % — ABNORMAL HIGH (ref 4.6–6.5)

## 2016-02-23 NOTE — Telephone Encounter (Signed)
l °

## 2016-02-23 NOTE — Progress Notes (Signed)
Subjective:     Patient ID: Jared Byrd, male   DOB: Aug 08, 1950, 65 y.o.   MRN: [id number]  HPI Patient here for physical exam. Chronic problems include history of type 2 diabetes, hypertension, dyslipidemia, gout. Recent right lateral hip pain. Slightly improved with anti-inflammatory. X-rays revealed mild osteoarthritis. He has orthopedic follow-up scheduled.  Not monitoring blood sugars recently. Last A1c 7.8%. No symptoms of polyuria or polydipsia. He turns 65 this year. No history of Prevnar. He had Pneumovax about 4 years ago.  Colonoscopy up-to-date. No consistent exercise. Nonsmoker.  Past Medical History:  Diagnosis Date  . COLONIC POLYPS, HX OF 02/03/2009  . DIAB W/O MENTION COMP TYPE II/UNS TYPE UNCNTRL 02/03/2009  . GERD 02/03/2009  . HYPERTENSION 01/31/2009  . METABOLIC SYNDROME X 0000000   Past Surgical History:  Procedure Laterality Date  . TONSILLECTOMY Bilateral     reports that he quit smoking about 40 years ago. His smoking use included Cigarettes. He has a 5.00 pack-year smoking history. He has never used smokeless tobacco. His alcohol and drug histories are not on file. family history includes Cancer in his mother and other; Cancer (age of onset: 39) in his sister; Diabetes in his mother; Heart disease in his father and other; Hyperlipidemia in his other; Hypertension in his father and other; Multiple endocrine neoplasia in his father. No Known Allergies   Review of Systems  Constitutional: Negative for activity change, appetite change, fatigue, fever and unexpected weight change.  HENT: Negative for congestion, ear pain and trouble swallowing.   Eyes: Negative for pain and visual disturbance.  Respiratory: Negative for cough, shortness of breath and wheezing.   Cardiovascular: Negative for chest pain and palpitations.  Gastrointestinal: Negative for abdominal distention, abdominal pain, blood in stool, constipation, diarrhea, nausea, rectal pain and vomiting.   Endocrine: Negative for polydipsia and polyuria.  Genitourinary: Negative for dysuria, hematuria and testicular pain.  Musculoskeletal: Negative for arthralgias and joint swelling.  Skin: Negative for rash.  Neurological: Negative for dizziness, syncope and headaches.  Hematological: Negative for adenopathy.  Psychiatric/Behavioral: Negative for confusion and dysphoric mood.       Objective:   Physical Exam  Constitutional: He is oriented to person, place, and time. He appears well-developed and well-nourished.  HENT:  Head: Normocephalic.  Right Ear: External ear normal.  Left Ear: External ear normal.  Mouth/Throat: Oropharynx is clear and moist.  Neck: Neck supple. No thyromegaly present.  Cardiovascular: Normal rate and regular rhythm.  Exam reveals no gallop.   No murmur heard. Pulmonary/Chest: Effort normal and breath sounds normal. No respiratory distress. He has no wheezes. He has no rales.  Abdominal: Soft. Bowel sounds are normal. He exhibits no distension and no mass. There is no tenderness. There is no rebound and no guarding.  Musculoskeletal: He exhibits no edema.  Lymphadenopathy:    He has no cervical adenopathy.  Neurological: He is alert and oriented to person, place, and time. No cranial nerve deficit.  Skin: No rash noted.  No foot lesions. No calluses. Good distal pulses. Patient has mild apparent with monofilament testing distally involving ventral aspect of several toes  Psychiatric: He has a normal mood and affect. His behavior is normal. Judgment and thought content normal.       Assessment:     Physical exam. Patient has chronic medical problems as above. Type 2 diabetes which has not been well controlled.    Plan:     -Check further lab work including screening labs and will  also include PSA and hepatitis C after discussion -He is encouraged to lose weight and establish more consistent exercise -Prevnar 13 given and recommended Pneumovax in 1  year -Reminder for yearly flu vaccination  Eulas Post MD [facility]

## 2016-02-23 NOTE — Progress Notes (Signed)
Pre visit review using our clinic review tool, if applicable. No additional management support is needed unless otherwise documented below in the visit note. 

## 2016-02-23 NOTE — Addendum Note (Signed)
Addended by: Elio Forget on: 02/23/2016 11:03 AM   Modules accepted: Orders

## 2016-02-24 ENCOUNTER — Encounter: Payer: Self-pay | Admitting: Family Medicine

## 2016-02-24 LAB — HEPATITIS C ANTIBODY: HCV Ab: NEGATIVE

## 2016-03-28 ENCOUNTER — Other Ambulatory Visit: Payer: Self-pay | Admitting: Family Medicine

## 2016-04-22 ENCOUNTER — Other Ambulatory Visit: Payer: Self-pay | Admitting: Family Medicine

## 2016-05-07 ENCOUNTER — Telehealth: Payer: Self-pay | Admitting: Family Medicine

## 2016-05-07 MED ORDER — INDOMETHACIN 50 MG PO CAPS: 50.0000 mg | ORAL_CAPSULE | Freq: Three times a day (TID) | ORAL | 1 refills | Status: DC | PRN

## 2016-05-07 NOTE — Telephone Encounter (Signed)
Pt need new Rx for indomethacin  Pharm:  [facility] [place]

## 2016-05-07 NOTE — Telephone Encounter (Signed)
Medication sent in for patient. 

## 2016-05-08 ENCOUNTER — Other Ambulatory Visit: Payer: Self-pay | Admitting: Family Medicine

## 2016-05-10 ENCOUNTER — Telehealth: Payer: Self-pay

## 2016-05-10 NOTE — Telephone Encounter (Signed)
Medication was sent in for patient on 05/07/2016 #30, 1rf.

## 2016-05-10 NOTE — Telephone Encounter (Signed)
FYI

## 2016-05-10 NOTE — Telephone Encounter (Signed)
Patient Name: KONAN HIROTA [facility] Gender: Male DOB: 06/19/1951 Age: 65 Y 52 M 24 D Return Phone Number: [phone] (Primary), [phone] (Secondary) Address: City/State/Zip:  Client [place] Night - Client Client Site [facility] - Night Physician Carolann Littler - MD Contact Type Call Who Is Calling Patient / Member / Family / Caregiver Call Type Triage / Clinical Caller Name Garold Broers Relationship To Patient Spouse Return Phone Number [phone] (Primary) Chief Complaint Joint Pain Reason for Call Symptomatic / Request for Edmund states husband c/o gout pain in knee, requests Rx refill Indomethacin 50mg . PreDisposition Did not know what to do Translation No Nurse Assessment Nurse: Dimas Chyle, RN, Dellis Filbert Date/Time Eilene Ghazi Time): 05/08/2016 8:29:02 AM Confirm and document reason for call. If symptomatic, describe symptoms. You must click the next button to save text entered. ---Caller states husband c/o gout pain in knee, requests Rx refill Indomethacin 50mg . Symptoms started 3 days ago. Has the patient traveled out of the country within the last 30 days? ---No Does the patient have any new or worsening symptoms? ---Yes Will a triage be completed? ---Yes Related visit to physician within the last 2 weeks? ---No Does the PT have any chronic conditions? (i.e. diabetes, asthma, etc.) ---Yes List chronic conditions. ---Diabetes type 2 Is this a behavioral health or substance abuse call? ---No Guidelines Guideline Title Affirmed Question Affirmed Notes Nurse Date/Time Eilene Ghazi Time) Foot Pain [1] Redness of the skin AND [2] no fever Terrence Dupont 05/08/2016 8:30:04 AM Disp. Time Eilene Ghazi Time) Disposition Final User 05/08/2016 8:21:05 AM Send To Clinical Follow Up Rich Brave, Amy 05/08/2016 8:32:01 AM See Physician within 24 Hours Yes Dimas Chyle, Therapist, sports, Dellis Filbert

## 2016-08-17 ENCOUNTER — Other Ambulatory Visit: Payer: Self-pay | Admitting: Family Medicine

## 2016-12-24 ENCOUNTER — Other Ambulatory Visit: Payer: Self-pay | Admitting: Family Medicine

## 2017-01-17 ENCOUNTER — Other Ambulatory Visit: Payer: Self-pay | Admitting: Family Medicine

## 2017-03-01 ENCOUNTER — Encounter: Payer: Self-pay | Admitting: Family Medicine

## 2017-03-01 ENCOUNTER — Ambulatory Visit (INDEPENDENT_AMBULATORY_CARE_PROVIDER_SITE_OTHER): Payer: BLUE CROSS/BLUE SHIELD | Admitting: Family Medicine

## 2017-03-01 VITALS — BP 110/72 | HR 71 | Temp 98.3°F | Ht 74.0 in | Wt 238.8 lb

## 2017-03-01 DIAGNOSIS — Z0000 Encounter for general adult medical examination without abnormal findings: Secondary | ICD-10-CM

## 2017-03-01 LAB — LIPID PANEL
CHOLESTEROL: 125 mg/dL (ref 0–200)
HDL: 39.1 mg/dL (ref >39.00)
NonHDL: 85.43
TRIGLYCERIDES: 213 mg/dL — AB (ref 0.0–149.0)
Total CHOL/HDL Ratio: 3
VLDL: 42.6 mg/dL — ABNORMAL HIGH (ref 0.0–40.0)

## 2017-03-01 LAB — CBC WITH DIFFERENTIAL/PLATELET
BASOS ABS: 0 10*3/uL (ref 0.0–0.1)
Basophils Relative: 0.2 % (ref 0.0–3.0)
EOS PCT: 2.7 % (ref 0.0–5.0)
Eosinophils Absolute: 0.2 10*3/uL (ref 0.0–0.7)
HCT: 43.3 % (ref 39.0–52.0)
HEMOGLOBIN: 14.6 g/dL (ref 13.0–17.0)
LYMPHS ABS: 1.9 10*3/uL (ref 0.7–4.0)
LYMPHS PCT: 29 % (ref 12.0–46.0)
MCHC: 33.6 g/dL (ref 30.0–36.0)
MCV: 91.7 fl (ref 78.0–100.0)
MONOS PCT: 8.6 % (ref 3.0–12.0)
Monocytes Absolute: 0.6 10*3/uL (ref 0.1–1.0)
NEUTROS PCT: 59.5 % (ref 43.0–77.0)
Neutro Abs: 3.9 10*3/uL (ref 1.4–7.7)
Platelets: 282 10*3/uL (ref 150.0–400.0)
RBC: 4.72 Mil/uL (ref 4.22–5.81)
RDW: 12.9 % (ref 11.5–15.5)
WBC: 6.5 10*3/uL (ref 4.0–10.5)

## 2017-03-01 LAB — BASIC METABOLIC PANEL
BUN: 20 mg/dL (ref 6–23)
CALCIUM: 10 mg/dL (ref 8.4–10.5)
CHLORIDE: 97 meq/L (ref 96–112)
CO2: 28 mEq/L (ref 19–32)
CREATININE: 0.86 mg/dL (ref 0.40–1.50)
GFR: 94.43 mL/min (ref >60.00)
Glucose, Bld: 165 mg/dL — ABNORMAL HIGH (ref 70–99)
Potassium: 4.5 mEq/L (ref 3.5–5.1)
Sodium: 135 mEq/L (ref 135–145)

## 2017-03-01 LAB — HEPATIC FUNCTION PANEL
ALBUMIN: 4.6 g/dL (ref 3.5–5.2)
ALK PHOS: 53 U/L (ref 39–117)
ALT: 31 U/L (ref 0–53)
AST: 28 U/L (ref 0–37)
BILIRUBIN DIRECT: 0.2 mg/dL (ref 0.0–0.3)
TOTAL PROTEIN: 6.5 g/dL (ref 6.0–8.3)
Total Bilirubin: 1 mg/dL (ref 0.2–1.2)

## 2017-03-01 LAB — LDL CHOLESTEROL, DIRECT: LDL DIRECT: 68 mg/dL

## 2017-03-01 LAB — TSH: TSH: 0.27 u[IU]/mL — ABNORMAL LOW (ref 0.35–4.50)

## 2017-03-01 LAB — HEMOGLOBIN A1C: Hgb A1c MFr Bld: 8.5 % — ABNORMAL HIGH (ref 4.6–6.5)

## 2017-03-01 LAB — PSA: PSA: 1.01 ng/mL (ref 0.10–4.00)

## 2017-03-01 NOTE — Progress Notes (Signed)
Subjective:     Patient ID: Juanita Devincent, male   DOB: [birth year], 66 y.o.   MRN: [id number]  HPI Patient here for physical exam. He has history of obesity, hyperlipidemia, type 2 diabetes, hypertension, and gout. No recent gout flareups. Does not monitor blood sugars regularly. No polyuria or polydipsia. Weight relatively stable. Still works full-time. He had previous shingles vaccine. Other physicians up-to-date. He does request PSA testing. No diabetic eye exam in over one year.  No chest pain or other complaints  Past Medical History:  Diagnosis Date  . COLONIC POLYPS, HX OF 02/03/2009  . DIAB W/O MENTION COMP TYPE II/UNS TYPE UNCNTRL 02/03/2009  . GERD 02/03/2009  . HYPERTENSION 01/31/2009  . METABOLIC SYNDROME X 2/97/9892   Past Surgical History:  Procedure Laterality Date  . TONSILLECTOMY Bilateral     reports that he quit smoking about 41 years ago. His smoking use included Cigarettes. He has a 5.00 pack-year smoking history. He has never used smokeless tobacco. His alcohol and drug histories are not on file. family history includes Cancer in his mother and other; Cancer (age of onset: 37) in his sister; Diabetes in his mother; Heart disease in his father and other; Hyperlipidemia in his other; Hypertension in his father and other; Multiple endocrine neoplasia in his father. No Known Allergies   Review of Systems  Constitutional: Negative for activity change, appetite change, fatigue and fever.  HENT: Negative for congestion, ear pain and trouble swallowing.   Eyes: Negative for pain and visual disturbance.  Respiratory: Negative for cough, shortness of breath and wheezing.   Cardiovascular: Negative for chest pain and palpitations.  Gastrointestinal: Negative for abdominal distention, abdominal pain, blood in stool, constipation, diarrhea, nausea, rectal pain and vomiting.  Genitourinary: Negative for dysuria, hematuria and testicular pain.  Musculoskeletal: Negative for  arthralgias and joint swelling.  Skin: Negative for rash.  Neurological: Negative for dizziness, syncope and headaches.  Hematological: Negative for adenopathy.  Psychiatric/Behavioral: Negative for confusion and dysphoric mood.       Objective:   Physical Exam  Constitutional: He is oriented to person, place, and time. He appears well-developed and well-nourished. No distress.  HENT:  Head: Normocephalic and atraumatic.  Right Ear: External ear normal.  Left Ear: External ear normal.  Mouth/Throat: Oropharynx is clear and moist.  Eyes: Pupils are equal, round, and reactive to light. Conjunctivae and EOM are normal.  Neck: Normal range of motion. Neck supple. No thyromegaly present.  Cardiovascular: Normal rate, regular rhythm and normal heart sounds.   No murmur heard. Pulmonary/Chest: No respiratory distress. He has no wheezes. He has no rales.  Abdominal: Soft. Bowel sounds are normal. He exhibits no distension and no mass. There is no tenderness. There is no rebound and no guarding.  Musculoskeletal: He exhibits no edema.  Lymphadenopathy:    He has no cervical adenopathy.  Neurological: He is alert and oriented to person, place, and time. He displays normal reflexes. No cranial nerve deficit.  Skin: No rash noted.  Feet reveal no skin lesions. Good distal foot pulses. Good capillary refill. No calluses. Normal sensation with monofilament testing   Psychiatric: He has a normal mood and affect.       Assessment:     Physical exam. Patient has multiple stable chronic medical problems as above    Plan:     -Obtain screening lab work -The natural history of prostate cancer and ongoing controversy regarding screening and potential treatment outcomes of prostate cancer has been discussed  with the patient. The meaning of a false positive PSA and a false negative PSA has been discussed. He indicates understanding of the limitations of this screening test and wishes to proceed with  screening PSA testing. -Recommendation to set up diabetic eye exam -Discussed shingles vaccine and he will consider-but check on insurance coverage first -Is encouraged to lose some weight.  Eulas Post MD [facility] at [facility]

## 2017-03-01 NOTE — Patient Instructions (Signed)
Set up diabetic eye exam this year. Consider new shingles vaccine (Shingrix) and let us know if interested.

## 2017-03-02 ENCOUNTER — Other Ambulatory Visit: Payer: Self-pay | Admitting: Family Medicine

## 2017-03-02 MED ORDER — EMPAGLIFLOZIN 10 MG PO TABS: 10.0000 mg | ORAL_TABLET | Freq: Every day | ORAL | 3 refills | Status: DC

## 2017-03-24 ENCOUNTER — Other Ambulatory Visit: Payer: Self-pay | Admitting: Family Medicine

## 2017-03-25 ENCOUNTER — Telehealth: Payer: Self-pay | Admitting: Family Medicine

## 2017-03-25 MED ORDER — ZOSTER VAC RECOMB ADJUVANTED 50 MCG/0.5ML IM SUSR: 0.5000 mL | Freq: Once | INTRAMUSCULAR | 1 refills | Status: AC

## 2017-03-25 NOTE — Telephone Encounter (Signed)
Rx sent 

## 2017-03-25 NOTE — Telephone Encounter (Signed)
Wife states we called in Rx for her shingrix and she would like you to do the same for pt, her husband. [facility] [phone]  This pharmacy not on his list, but this one it is beside his work.

## 2017-04-07 ENCOUNTER — Encounter: Payer: Self-pay | Admitting: Family Medicine

## 2017-04-17 ENCOUNTER — Other Ambulatory Visit: Payer: Self-pay | Admitting: Family Medicine

## 2017-05-03 ENCOUNTER — Telehealth: Payer: Self-pay | Admitting: *Deleted

## 2017-05-03 MED ORDER — EMPAGLIFLOZIN 10 MG PO TABS: 10.0000 mg | ORAL_TABLET | Freq: Every day | ORAL | 3 refills | Status: DC

## 2017-05-03 NOTE — Telephone Encounter (Signed)
Patient's wife called stating jardiance needs to be sent to [facility], [facility] [place] [facility] cost [facility] told the wife that they are being punished because it didn't go through them. Patient's wife stated [facility] told them this time if the nurse gives them a coupon [facility] [place] it will cost 25$ for this month only. Please advise [phone]  Per wife for now on the prescription needs to be sent to [facility] 3 month supply only.   Patient's wife requested a return call

## 2017-05-03 NOTE — Telephone Encounter (Signed)
Refills sent and coupon ready for pick up.  Wife is aware.

## 2017-05-04 ENCOUNTER — Telehealth: Payer: Self-pay | Admitting: Family Medicine

## 2017-05-04 MED ORDER — EMPAGLIFLOZIN 10 MG PO TABS: 10.0000 mg | ORAL_TABLET | Freq: Every day | ORAL | 1 refills | Status: DC

## 2017-05-04 NOTE — Telephone Encounter (Signed)
Jared Byrd is calling pt needs new rx jardiance 10 mg #90 instead of #30. The price is the same. [facility] [place]

## 2017-05-04 NOTE — Telephone Encounter (Signed)
Rx sent 

## 2017-06-03 ENCOUNTER — Encounter: Payer: Self-pay | Admitting: Family Medicine

## 2017-06-03 ENCOUNTER — Ambulatory Visit (INDEPENDENT_AMBULATORY_CARE_PROVIDER_SITE_OTHER): Payer: BLUE CROSS/BLUE SHIELD | Admitting: Family Medicine

## 2017-06-03 VITALS — BP 102/70 | HR 77 | Temp 98.0°F | Wt 229.7 lb

## 2017-06-03 DIAGNOSIS — R7989 Other specified abnormal findings of blood chemistry: Secondary | ICD-10-CM

## 2017-06-03 DIAGNOSIS — E1165 Type 2 diabetes mellitus with hyperglycemia: Secondary | ICD-10-CM

## 2017-06-03 LAB — TSH: TSH: 0.84 u[IU]/mL (ref 0.35–4.50)

## 2017-06-03 LAB — POCT GLYCOSYLATED HEMOGLOBIN (HGB A1C): Hemoglobin A1C: 6.7

## 2017-06-03 LAB — T4, FREE: FREE T4: 1.01 ng/dL (ref 0.60–1.60)

## 2017-06-03 NOTE — Progress Notes (Signed)
Subjective:     Patient ID: Jared Byrd, male   DOB: August 23, 1950, 66 y.o.   MRN: [id number]  HPI Patient is here for follow-up type 2 diabetes and recent abnormal TSH.  A1c 8.5%. He was already taking metformin. We added Jardiance 10 mg daily. He's had no side effects. Compliant with therapy. He's lost about 10 pounds by his scales. Feels better overall. He has made some dietary changes with reducing sugars and starches. A1c today 6.7%  Second issue is recent labs revealed low TSH of 0.27. He has no symptoms of hyperthyroidism such as diarrhea, tremors, tachycardia. He has had some weight loss but this is likely related to the Jardiance and dietary changes. No prior history of thyroid disorder.  Past Medical History:  Diagnosis Date  . COLONIC POLYPS, HX OF 02/03/2009  . DIAB W/O MENTION COMP TYPE II/UNS TYPE UNCNTRL 02/03/2009  . GERD 02/03/2009  . HYPERTENSION 01/31/2009  . METABOLIC SYNDROME X 1/93/7902   Past Surgical History:  Procedure Laterality Date  . TONSILLECTOMY Bilateral     reports that he quit smoking about 41 years ago. His smoking use included cigarettes. He has a 5.00 pack-year smoking history. he has never used smokeless tobacco. His alcohol and drug histories are not on file. family history includes Cancer in his mother and other; Cancer (age of onset: 77) in his sister; Diabetes in his mother; Heart disease in his father and other; Hyperlipidemia in his other; Hypertension in his father and other; Multiple endocrine neoplasia in his father. No Known Allergies   Review of Systems  Constitutional: Negative for fatigue and unexpected weight change.  Eyes: Negative for visual disturbance.  Respiratory: Negative for cough, chest tightness and shortness of breath.   Cardiovascular: Negative for chest pain, palpitations and leg swelling.  Endocrine: Negative for cold intolerance, heat intolerance, polydipsia and polyuria.  Neurological: Negative for dizziness, syncope,  weakness, light-headedness and headaches.       Objective:   Physical Exam  Constitutional: He is oriented to person, place, and time. He appears well-developed and well-nourished.  HENT:  Right Ear: External ear normal.  Left Ear: External ear normal.  Mouth/Throat: Oropharynx is clear and moist.  Eyes: Pupils are equal, round, and reactive to light.  Neck: Neck supple. No thyromegaly present.  Cardiovascular: Normal rate and regular rhythm.  Pulmonary/Chest: Effort normal and breath sounds normal. No respiratory distress. He has no wheezes. He has no rales.  Musculoskeletal: He exhibits no edema.  Neurological: He is alert and oriented to person, place, and time.       Assessment:     #1 type 2 diabetes improved with A1c 6.7%  #2 recent abnormal TSH which was low at 0.27. No overt symptoms of hyperthyroidism    Plan:     -Recheck TSH and free T4 -Continue recent positive lifestyle changes -We'll plan routine follow-up in 6 months and recheck A1c then and follow-up sooner as needed  Eulas Post MD [facility] at [facility]

## 2017-08-14 ENCOUNTER — Other Ambulatory Visit: Payer: Self-pay | Admitting: Family Medicine

## 2017-08-25 IMAGING — DX DG HIP (WITH OR WITHOUT PELVIS) 2-3V*R*
3 series · 3 of 3 positions shown · non-contrast
Comparison: None in PACs

CLINICAL DATA: Right hip pain laterally for the past 2 days without
known injury

EXAM:
DG HIP (WITH OR WITHOUT PELVIS) 2-3V RIGHT

[pelvis ap]
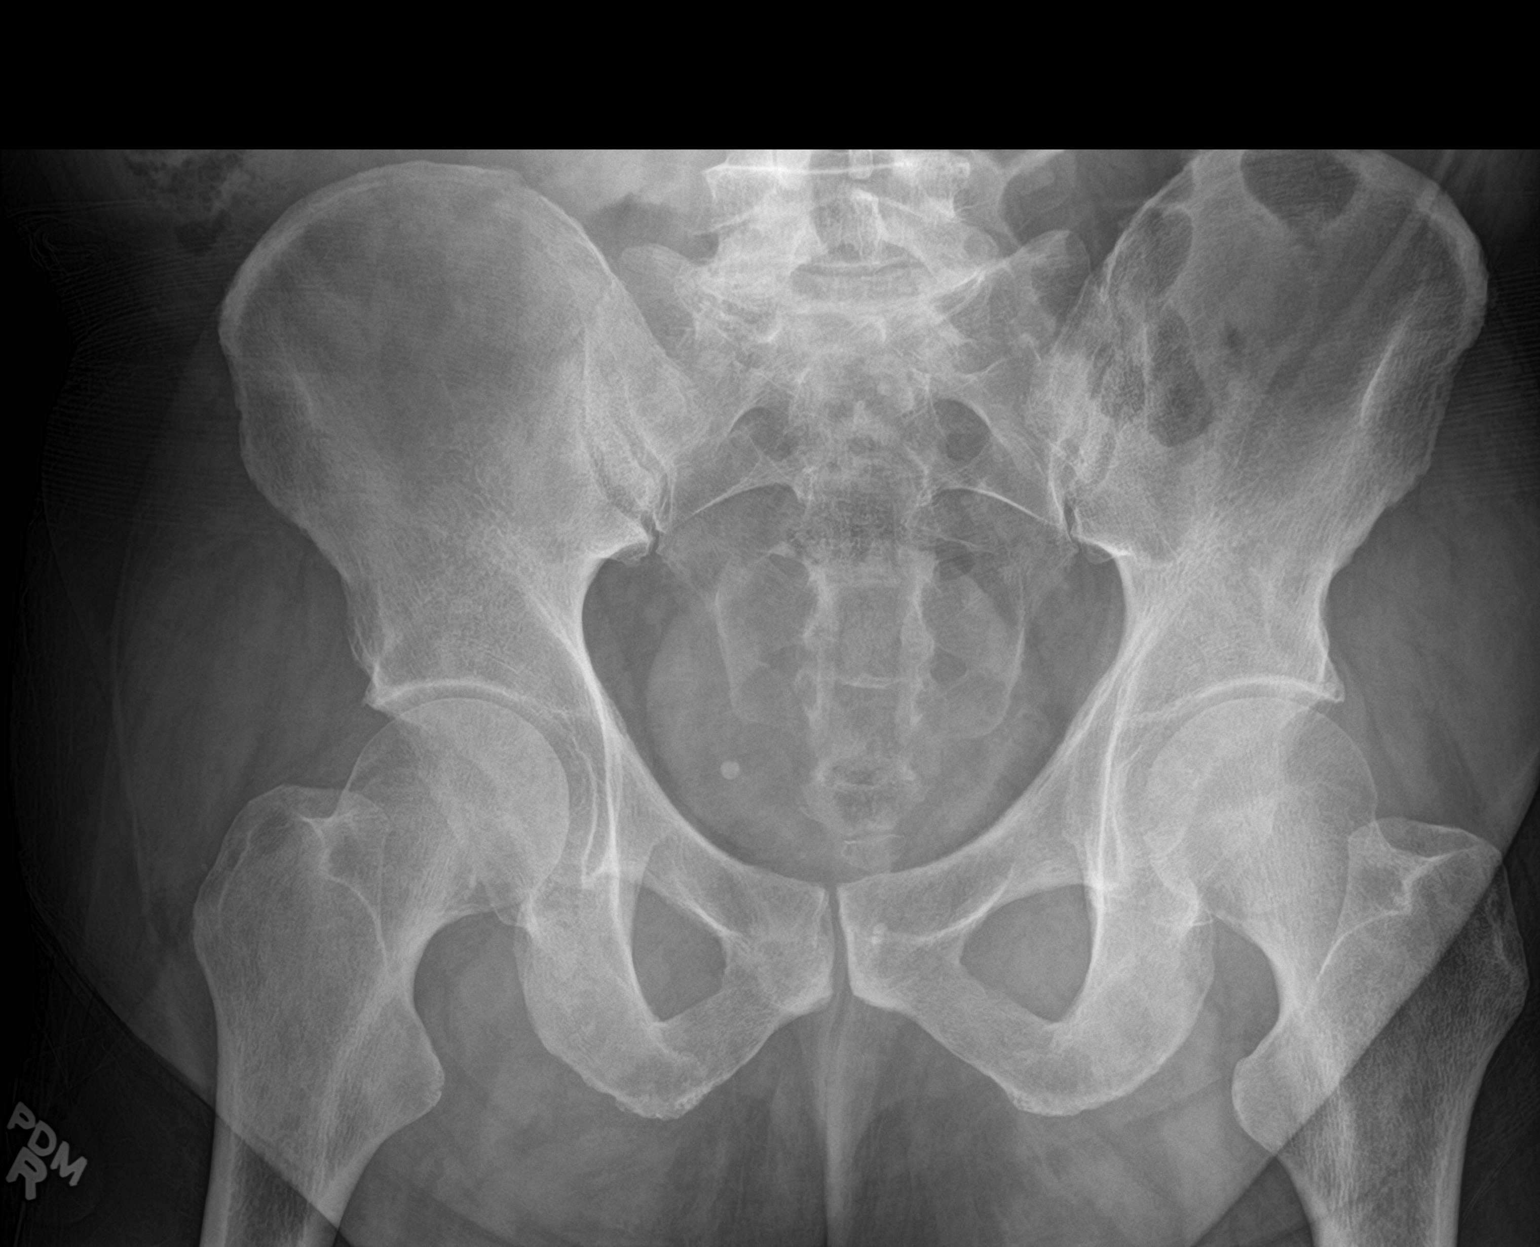

[hip ap]
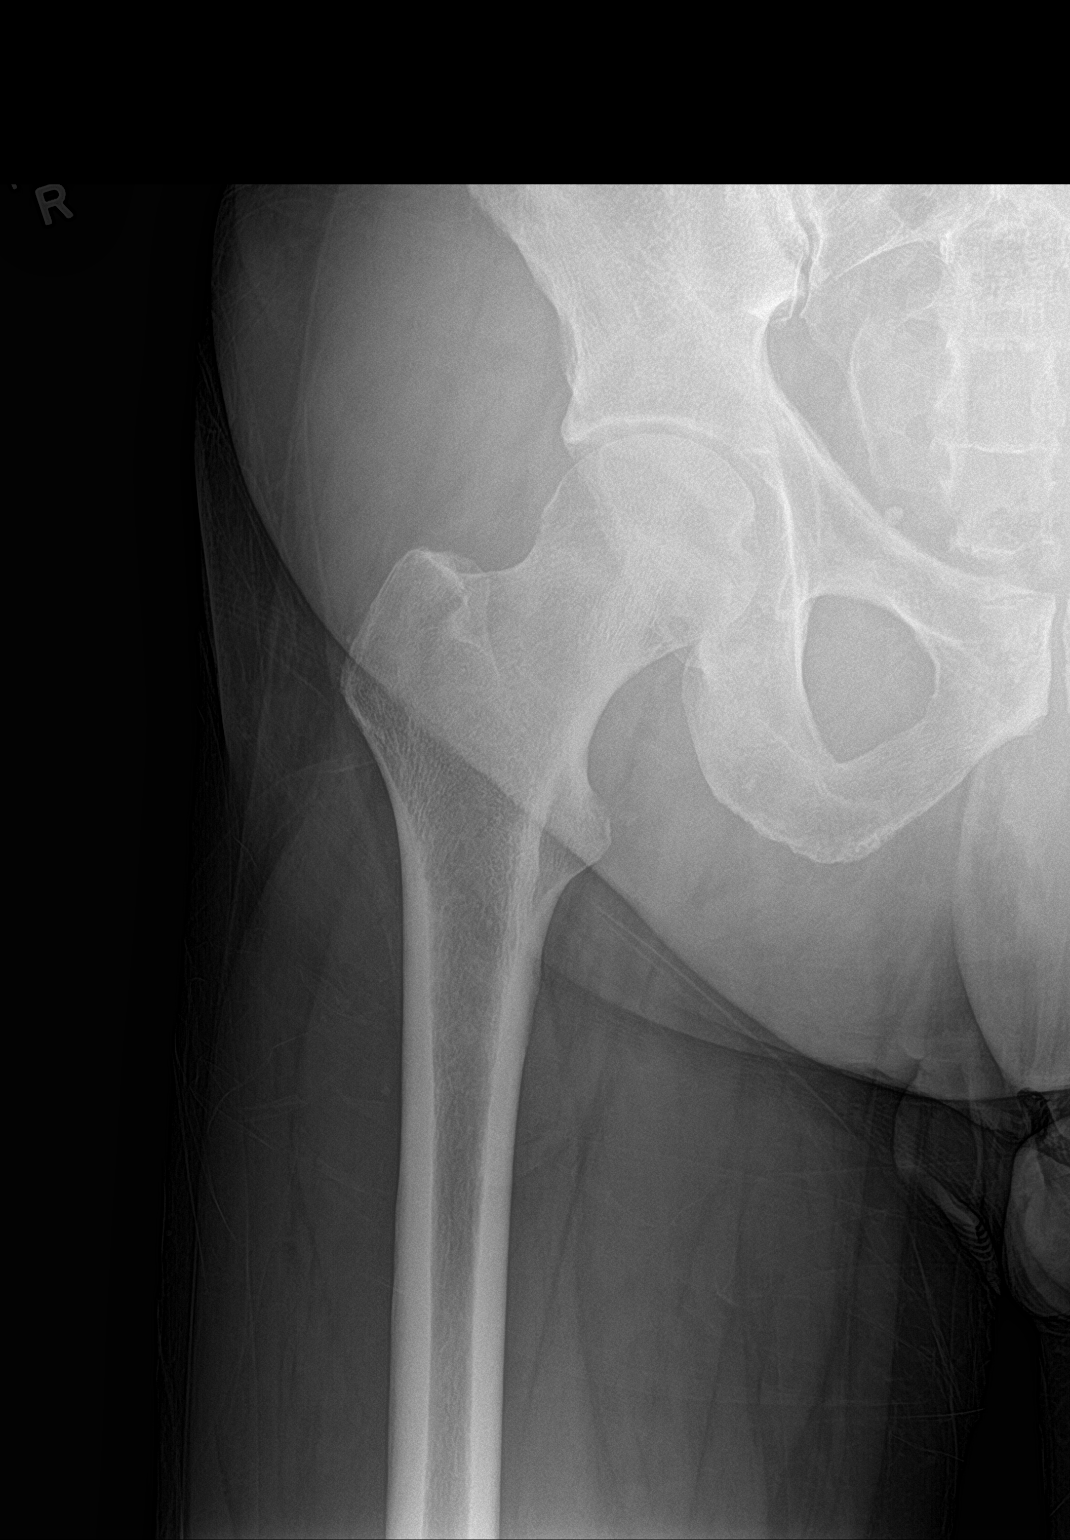

[hip lat]
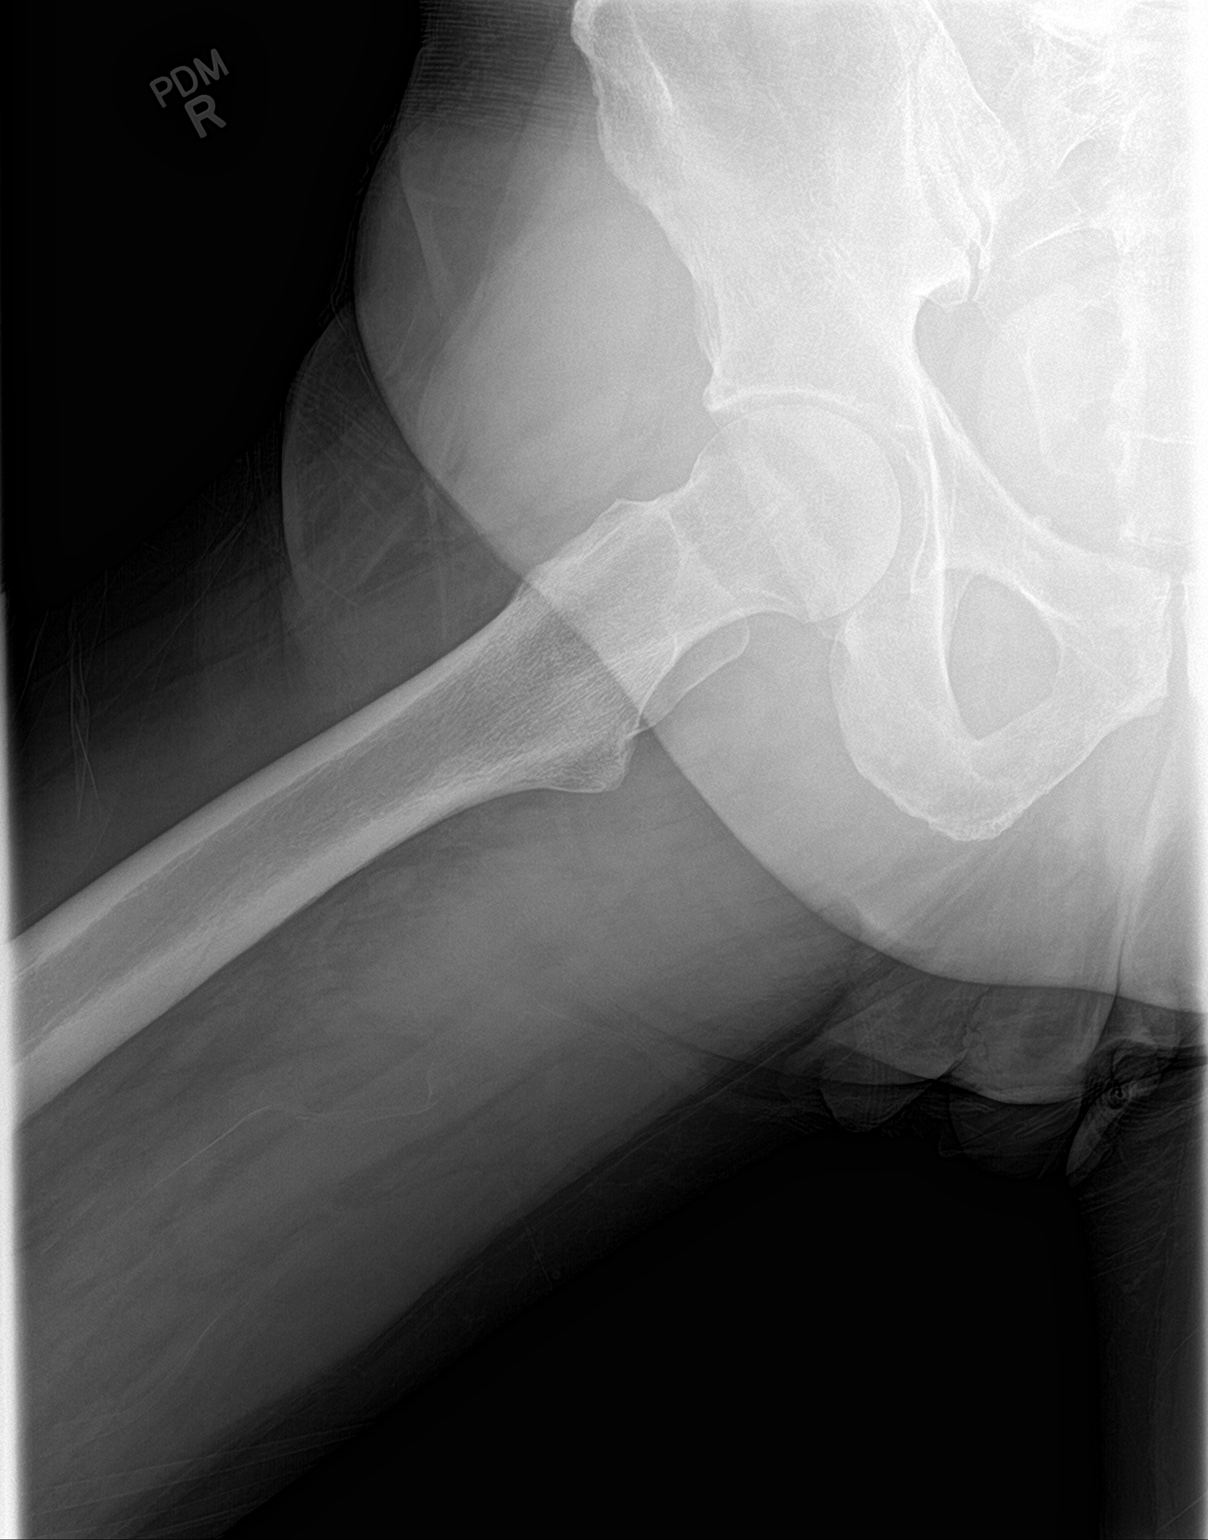

[3 of 3 positions shown; findings below may reference images not displayed]

FINDINGS: The bony pelvis is subjectively adequately mineralized. There is no
lytic or blastic lesion. AP and lateral views of the right hip
reveal mild symmetric narrowing of the joint space. This is similar
to that seen on the left on the AP pelvis view. The articular
surfaces of the right femoral head and acetabulum remains smoothly
rounded. The femoral neck, intertrochanteric, and subtrochanteric
regions are normal.
IMPRESSION: There is mild symmetric narrowing of the right hip joint space
consistent with osteoarthritis. No bony changes are observed.

## 2017-08-30 ENCOUNTER — Ambulatory Visit: Payer: BLUE CROSS/BLUE SHIELD | Admitting: Family Medicine

## 2017-09-20 ENCOUNTER — Other Ambulatory Visit: Payer: Self-pay | Admitting: Family Medicine

## 2017-10-14 ENCOUNTER — Other Ambulatory Visit: Payer: Self-pay | Admitting: Family Medicine

## 2017-11-18 ENCOUNTER — Telehealth: Payer: Self-pay | Admitting: *Deleted

## 2017-11-18 NOTE — Telephone Encounter (Signed)
Copied from [place] [id number]. Topic: General - Other >> Nov 18, 2017  1:03 PM Yvette Rack wrote: Reason for CRM: patient wife calling to see if her husband can get the measles booster immunization and if so she would like for [facility] to [id number] [place] [place] [phone] (Phone) [phone] (Fax)

## 2017-11-21 NOTE — Telephone Encounter (Signed)
As per previous note for spouse, he can get MMR booster- but CDC guidelines would include those born < 1957 as presumptive evidence of immunity.

## 2017-11-21 NOTE — Telephone Encounter (Signed)
Called patient's wife and left detailed message (per DPR) to return call if questions. Recommended she contact her pharmacy to confirm availability of the vaccine and to find out if a prescription is needed.

## 2017-12-02 ENCOUNTER — Ambulatory Visit: Payer: BLUE CROSS/BLUE SHIELD | Admitting: Family Medicine

## 2017-12-16 ENCOUNTER — Encounter: Payer: Self-pay | Admitting: Family Medicine

## 2017-12-16 ENCOUNTER — Ambulatory Visit (INDEPENDENT_AMBULATORY_CARE_PROVIDER_SITE_OTHER): Payer: BLUE CROSS/BLUE SHIELD | Admitting: Family Medicine

## 2017-12-16 VITALS — BP 110/80 | HR 68 | Temp 98.3°F | Wt 233.3 lb

## 2017-12-16 DIAGNOSIS — E785 Hyperlipidemia, unspecified: Secondary | ICD-10-CM

## 2017-12-16 DIAGNOSIS — I10 Essential (primary) hypertension: Secondary | ICD-10-CM | POA: Diagnosis not present

## 2017-12-16 DIAGNOSIS — E1165 Type 2 diabetes mellitus with hyperglycemia: Secondary | ICD-10-CM | POA: Diagnosis not present

## 2017-12-16 LAB — POCT GLYCOSYLATED HEMOGLOBIN (HGB A1C): Hemoglobin A1C: 7.6 % — AB (ref 4.0–5.6)

## 2017-12-16 NOTE — Progress Notes (Signed)
  Subjective:     Patient ID: Jared Byrd, male   DOB: 12-05-1950, 67 y.o.   MRN: [id number]  HPI Seen for medical follow-up. Type 2 diabetes. Last A1c 6.7%. Poor compliance with diet recently. Not monitoring blood sugars rarely. No polyuria or polydipsia.  He has not had eye exam in over a year but plans to set this up soon. His other medical problems include history of hyperlipidemia and hypertension. Compliant with medications. Denies any side effects. He has history of metabolic syndrome.  Past Medical History:  Diagnosis Date  . COLONIC POLYPS, HX OF 02/03/2009  . DIAB W/O MENTION COMP TYPE II/UNS TYPE UNCNTRL 02/03/2009  . GERD 02/03/2009  . HYPERTENSION 01/31/2009  . METABOLIC SYNDROME X 0/48/8891   Past Surgical History:  Procedure Laterality Date  . TONSILLECTOMY Bilateral     reports that he quit smoking about 41 years ago. His smoking use included cigarettes. He has a 5.00 pack-year smoking history. He has never used smokeless tobacco. His alcohol and drug histories are not on file. family history includes Cancer in his mother and other; Cancer (age of onset: 91) in his sister; Diabetes in his mother; Heart disease in his father and other; Hyperlipidemia in his other; Hypertension in his father and other; Multiple endocrine neoplasia in his father. No Known Allergies   Review of Systems  Constitutional: Negative for fatigue.  Eyes: Negative for visual disturbance.  Respiratory: Negative for cough, chest tightness and shortness of breath.   Cardiovascular: Negative for chest pain, palpitations and leg swelling.  Endocrine: Negative for polydipsia and polyuria.  Neurological: Negative for dizziness, syncope, weakness, light-headedness and headaches.       Objective:   Physical Exam  Constitutional: He is oriented to person, place, and time. He appears well-developed and well-nourished.  HENT:  Right Ear: External ear normal.  Left Ear: External ear normal.   Mouth/Throat: Oropharynx is clear and moist.  Eyes: Pupils are equal, round, and reactive to light.  Neck: Neck supple. No thyromegaly present.  Cardiovascular: Normal rate and regular rhythm.  Pulmonary/Chest: Effort normal and breath sounds normal. No respiratory distress. He has no wheezes. He has no rales.  Musculoskeletal: He exhibits no edema.  Neurological: He is alert and oriented to person, place, and time.  Skin:  No ulcerations. Patient has callused underneath the left fourth toe near the nail. No ulceration  has some mild impairment with monofilament testing involving the right great toe and couple toes of left foot otherwise. normal       Assessment:     #1 type 2 diabetes suboptimally controlled with A1c today 7.6%. He has small callus left fourth toe and evidence for some early neuropathy involvement  #2 hypertension stable and well controlled  #3 hyperlipidemia on Lipitor    Plan:     -Recommend setting up for eye exam -Schedule physical for August -Reviewed options for better diabetes management. He prefers lifestyle versus additional medication. There some question of multiple endocrine neoplasia history in his dad and so GLP-1 class may not be an option -Consider podiatry evaluation regarding his feet  Jared Post MD [facility] at [facility]

## 2017-12-16 NOTE — Patient Instructions (Signed)
Set up eye exam  Lose some weight.

## 2018-03-03 ENCOUNTER — Encounter: Payer: Self-pay | Admitting: Family Medicine

## 2018-03-03 ENCOUNTER — Ambulatory Visit (INDEPENDENT_AMBULATORY_CARE_PROVIDER_SITE_OTHER): Payer: BLUE CROSS/BLUE SHIELD | Admitting: Family Medicine

## 2018-03-03 VITALS — BP 110/70 | HR 70 | Temp 98.3°F | Ht 73.5 in | Wt 229.2 lb

## 2018-03-03 DIAGNOSIS — Z23 Encounter for immunization: Secondary | ICD-10-CM | POA: Diagnosis not present

## 2018-03-03 DIAGNOSIS — Z0000 Encounter for general adult medical examination without abnormal findings: Secondary | ICD-10-CM | POA: Diagnosis not present

## 2018-03-03 DIAGNOSIS — R229 Localized swelling, mass and lump, unspecified: Secondary | ICD-10-CM

## 2018-03-03 DIAGNOSIS — Z125 Encounter for screening for malignant neoplasm of prostate: Secondary | ICD-10-CM

## 2018-03-03 LAB — CBC WITH DIFFERENTIAL/PLATELET
BASOS PCT: 0.2 % (ref 0.0–3.0)
Basophils Absolute: 0 10*3/uL (ref 0.0–0.1)
EOS PCT: 3.2 % (ref 0.0–5.0)
Eosinophils Absolute: 0.2 10*3/uL (ref 0.0–0.7)
HEMATOCRIT: 45.4 % (ref 39.0–52.0)
HEMOGLOBIN: 15.6 g/dL (ref 13.0–17.0)
LYMPHS PCT: 26.8 % (ref 12.0–46.0)
Lymphs Abs: 1.7 10*3/uL (ref 0.7–4.0)
MCHC: 34.3 g/dL (ref 30.0–36.0)
MCV: 89.6 fl (ref 78.0–100.0)
Monocytes Absolute: 0.5 10*3/uL (ref 0.1–1.0)
Monocytes Relative: 7.7 % (ref 3.0–12.0)
Neutro Abs: 3.9 10*3/uL (ref 1.4–7.7)
Neutrophils Relative %: 62.1 % (ref 43.0–77.0)
Platelets: 260 10*3/uL (ref 150.0–400.0)
RBC: 5.06 Mil/uL (ref 4.22–5.81)
RDW: 13.1 % (ref 11.5–15.5)
WBC: 6.2 10*3/uL (ref 4.0–10.5)

## 2018-03-03 LAB — HEPATIC FUNCTION PANEL
ALBUMIN: 4.6 g/dL (ref 3.5–5.2)
ALK PHOS: 56 U/L (ref 39–117)
ALT: 21 U/L (ref 0–53)
AST: 21 U/L (ref 0–37)
Bilirubin, Direct: 0.2 mg/dL (ref 0.0–0.3)
Total Bilirubin: 1.3 mg/dL — ABNORMAL HIGH (ref 0.2–1.2)
Total Protein: 7.2 g/dL (ref 6.0–8.3)

## 2018-03-03 LAB — PSA: PSA: 1.1 ng/mL (ref 0.10–4.00)

## 2018-03-03 LAB — BASIC METABOLIC PANEL
BUN: 25 mg/dL — ABNORMAL HIGH (ref 6–23)
CO2: 31 meq/L (ref 19–32)
CREATININE: 1.3 mg/dL (ref 0.40–1.50)
Calcium: 10.8 mg/dL — ABNORMAL HIGH (ref 8.4–10.5)
Chloride: 99 mEq/L (ref 96–112)
GFR: 58.44 mL/min — AB (ref >60.00)
GLUCOSE: 148 mg/dL — AB (ref 70–99)
Potassium: 4.4 mEq/L (ref 3.5–5.1)
SODIUM: 138 meq/L (ref 135–145)

## 2018-03-03 LAB — TSH: TSH: 2.69 u[IU]/mL (ref 0.35–4.50)

## 2018-03-03 LAB — HEMOGLOBIN A1C: HEMOGLOBIN A1C: 7.5 % — AB (ref 4.6–6.5)

## 2018-03-03 LAB — LDL CHOLESTEROL, DIRECT: Direct LDL: 81 mg/dL

## 2018-03-03 LAB — LIPID PANEL
Cholesterol: 141 mg/dL (ref 0–200)
HDL: 40.4 mg/dL (ref >39.00)
NONHDL: 100.42
Total CHOL/HDL Ratio: 3
Triglycerides: 209 mg/dL — ABNORMAL HIGH (ref 0.0–149.0)
VLDL: 41.8 mg/dL — ABNORMAL HIGH (ref 0.0–40.0)

## 2018-03-03 MED ORDER — EMPAGLIFLOZIN 10 MG PO TABS: 10.0000 mg | ORAL_TABLET | Freq: Every day | ORAL | 3 refills | Status: DC

## 2018-03-03 NOTE — Progress Notes (Signed)
Subjective:     Patient ID: Jared Byrd, male   DOB: 12/25/1950, 67 y.o.   MRN: [id number]  HPI Patient here for physical exam. He has history of type 2 diabetes, dyslipidemia, hypertension, gout, metabolic syndrome. Last A1c 7.6%. He has eye exam scheduled for September.  Skin lesion just above the left upper lip which he first noticed a few weeks ago. Fairly rapid growth. No past history of skin cancer. Nonpainful.  Health maintenance reviewed. Patient needs Pneumovax. He plans to get flu vaccine through his wife's work. Colonoscopy up-to-date  Past Medical History:  Diagnosis Date  . COLONIC POLYPS, HX OF 02/03/2009  . DIAB W/O MENTION COMP TYPE II/UNS TYPE UNCNTRL 02/03/2009  . GERD 02/03/2009  . HYPERTENSION 01/31/2009  . METABOLIC SYNDROME X 9/32/6712   Past Surgical History:  Procedure Laterality Date  . TONSILLECTOMY Bilateral     reports that he quit smoking about 42 years ago. His smoking use included cigarettes. He has a 5.00 pack-year smoking history. He has never used smokeless tobacco. His alcohol and drug histories are not on file. family history includes Cancer in his mother and other; Cancer (age of onset: 53) in his sister; Diabetes in his mother; Heart disease in his father and other; Hyperlipidemia in his other; Hypertension in his father and other; Multiple endocrine neoplasia in his father. No Known Allergies   Review of Systems  Constitutional: Negative for activity change, appetite change, fatigue and fever.  HENT: Negative for congestion, ear pain and trouble swallowing.   Eyes: Negative for pain and visual disturbance.  Respiratory: Negative for cough, shortness of breath and wheezing.   Cardiovascular: Negative for chest pain and palpitations.  Gastrointestinal: Negative for abdominal distention, abdominal pain, blood in stool, constipation, diarrhea, nausea, rectal pain and vomiting.  Endocrine: Negative for polydipsia and polyuria.  Genitourinary:  Negative for dysuria, hematuria and testicular pain.  Musculoskeletal: Negative for arthralgias and joint swelling.  Skin: Negative for rash.  Neurological: Negative for dizziness, syncope and headaches.  Hematological: Negative for adenopathy.  Psychiatric/Behavioral: Negative for confusion and dysphoric mood.       Objective:   Physical Exam  Constitutional: He is oriented to person, place, and time. He appears well-developed and well-nourished. No distress.  HENT:  Head: Normocephalic and atraumatic.  Right Ear: External ear normal.  Left Ear: External ear normal.  Mouth/Throat: Oropharynx is clear and moist.  Eyes: Pupils are equal, round, and reactive to light. Conjunctivae and EOM are normal.  Neck: Normal range of motion. Neck supple. No thyromegaly present.  Cardiovascular: Normal rate, regular rhythm and normal heart sounds.  No murmur heard. Pulmonary/Chest: No respiratory distress. He has no wheezes. He has no rales.  Abdominal: Soft. Bowel sounds are normal. He exhibits no distension and no mass. There is no tenderness. There is no rebound and no guarding.  Musculoskeletal: He exhibits no edema.  Lymphadenopathy:    He has no cervical adenopathy.  Neurological: He is alert and oriented to person, place, and time. He displays normal reflexes. No cranial nerve deficit.  Skin: No rash noted.  Small nodular growth just above the left upper lip. Approximately 4 mm diameter  Psychiatric: He has a normal mood and affect.       Assessment:     #1 physical exam. Patient has multiple medical problems including type 2 diabetes. Will reassess A1c today  #2 nodular skin growth left face just above upper lip. Rule out squamous cell carcinoma versus basal cell  Plan:     -Set up referral to [facility] surgery Center or [facility] dermatology for further evaluation -Pneumovax given -eye exam for September scheduled -Obtain screening lab work -Routine follow-up in 3 months and  sooner as needed  Eulas Post MD [facility] at [facility]

## 2018-03-03 NOTE — Patient Instructions (Addendum)
We are setting up dermatology referral.    Try to lose some weight - as discussed.

## 2018-03-19 ENCOUNTER — Other Ambulatory Visit: Payer: Self-pay | Admitting: Family Medicine

## 2018-03-22 LAB — HM DIABETES EYE EXAM

## 2018-03-30 ENCOUNTER — Encounter: Payer: Self-pay | Admitting: Family Medicine

## 2018-04-12 ENCOUNTER — Other Ambulatory Visit: Payer: Self-pay | Admitting: Family Medicine

## 2018-05-02 ENCOUNTER — Encounter: Payer: Self-pay | Admitting: Family Medicine

## 2018-05-11 ENCOUNTER — Other Ambulatory Visit: Payer: Self-pay | Admitting: Family Medicine

## 2018-05-11 DIAGNOSIS — Z0000 Encounter for general adult medical examination without abnormal findings: Secondary | ICD-10-CM

## 2018-06-07 ENCOUNTER — Encounter: Payer: Self-pay | Admitting: Family Medicine

## 2018-06-07 ENCOUNTER — Other Ambulatory Visit: Payer: Self-pay

## 2018-06-07 ENCOUNTER — Ambulatory Visit (INDEPENDENT_AMBULATORY_CARE_PROVIDER_SITE_OTHER): Payer: BLUE CROSS/BLUE SHIELD | Admitting: Family Medicine

## 2018-06-07 VITALS — BP 114/78 | HR 65 | Temp 98.1°F | Ht 73.5 in | Wt 232.5 lb

## 2018-06-07 DIAGNOSIS — I10 Essential (primary) hypertension: Secondary | ICD-10-CM

## 2018-06-07 DIAGNOSIS — N289 Disorder of kidney and ureter, unspecified: Secondary | ICD-10-CM | POA: Diagnosis not present

## 2018-06-07 DIAGNOSIS — E1165 Type 2 diabetes mellitus with hyperglycemia: Secondary | ICD-10-CM

## 2018-06-07 LAB — BASIC METABOLIC PANEL
BUN: 20 mg/dL (ref 6–23)
CHLORIDE: 98 meq/L (ref 96–112)
CO2: 26 meq/L (ref 19–32)
CREATININE: 1.13 mg/dL (ref 0.40–1.50)
Calcium: 10.1 mg/dL (ref 8.4–10.5)
GFR: 68.65 mL/min (ref >60.00)
GLUCOSE: 136 mg/dL — AB (ref 70–99)
POTASSIUM: 4.4 meq/L (ref 3.5–5.1)
Sodium: 136 mEq/L (ref 135–145)

## 2018-06-07 LAB — HEMOGLOBIN A1C: HEMOGLOBIN A1C: 7.6 % — AB (ref 4.6–6.5)

## 2018-06-07 NOTE — Progress Notes (Signed)
  Subjective:     Patient ID: Jared Byrd, male   DOB: 11-10-1950, 67 y.o.   MRN: [id number]  HPI Here for medical follow-up.  We addressed the following issues today  Type 2 diabetes.  Last A1c 7.5%.  He takes metformin and Jardiance.  Good compliance with medications.  Not monitoring blood sugars regularly.  No consistent exercise.  We hope that he would lose some weight but his weight is relatively stable  Recent decline in GFR to 58.  His baseline is usually around 90.  We suspected possibly prerenal.  Reduced HCTZ to 12.5 mg daily.  No peripheral edema.  No nonsteroidal use.  Hypertension.  Blood pressures been stable with recent reduction in HCTZ dose.  No dizziness.  No headaches.  No chest pains.  Past Medical History:  Diagnosis Date  . COLONIC POLYPS, HX OF 02/03/2009  . DIAB W/O MENTION COMP TYPE II/UNS TYPE UNCNTRL 02/03/2009  . GERD 02/03/2009  . HYPERTENSION 01/31/2009  . METABOLIC SYNDROME X 7/85/8850   Past Surgical History:  Procedure Laterality Date  . TONSILLECTOMY Bilateral     reports that he quit smoking about 42 years ago. His smoking use included cigarettes. He has a 5.00 pack-year smoking history. He has never used smokeless tobacco. His alcohol and drug histories are not on file. family history includes Cancer in his mother and other; Cancer (age of onset: 68) in his sister; Diabetes in his mother; Heart disease in his father and other; Hyperlipidemia in his other; Hypertension in his father and other; Multiple endocrine neoplasia in his father. No Known Allergies   Review of Systems  Constitutional: Negative for fatigue.  Eyes: Negative for visual disturbance.  Respiratory: Negative for cough, chest tightness and shortness of breath.   Cardiovascular: Negative for chest pain, palpitations and leg swelling.  Endocrine: Negative for polydipsia and polyuria.  Neurological: Negative for dizziness, syncope, weakness, light-headedness and headaches.        Objective:   Physical Exam  Constitutional: He appears well-developed and well-nourished.  Cardiovascular: Normal rate and regular rhythm.  Pulmonary/Chest: Effort normal and breath sounds normal. He has no wheezes. He has no rales.  Musculoskeletal: He exhibits no edema.       Assessment:     #1 hypertension-stable and at goal  #2 type 2 diabetes.  Recent suboptimal control.  Will recheck A1c today  #3 recent decline in GFR.  Suspect prerenal    Plan:     -Recheck basic metabolic panel and Y7X -We discussed options for further management of diabetes if not better control.  He is requesting 3 more month trial of lifestyle management.  Consider possible GLP-1 medication if not getting further to goal  Eulas Post MD [place] at [facility]

## 2018-06-14 ENCOUNTER — Ambulatory Visit: Payer: BLUE CROSS/BLUE SHIELD | Admitting: Family Medicine

## 2018-08-10 ENCOUNTER — Other Ambulatory Visit: Payer: Self-pay | Admitting: Family Medicine

## 2018-08-10 MED ORDER — METFORMIN HCL 500 MG PO TABS: ORAL_TABLET | ORAL | 4 refills | Status: DC

## 2018-08-10 NOTE — Addendum Note (Signed)
Addended by: Zacarias Pontes on: 08/10/2018 08:17 AM   Modules accepted: Orders

## 2018-09-06 ENCOUNTER — Other Ambulatory Visit: Payer: Self-pay

## 2018-09-06 ENCOUNTER — Encounter: Payer: Self-pay | Admitting: Family Medicine

## 2018-09-06 ENCOUNTER — Ambulatory Visit (INDEPENDENT_AMBULATORY_CARE_PROVIDER_SITE_OTHER): Payer: BLUE CROSS/BLUE SHIELD | Admitting: Family Medicine

## 2018-09-06 VITALS — BP 114/68 | HR 74 | Temp 98.1°F | Ht 73.5 in | Wt 230.5 lb

## 2018-09-06 DIAGNOSIS — I10 Essential (primary) hypertension: Secondary | ICD-10-CM

## 2018-09-06 DIAGNOSIS — M1A9XX0 Chronic gout, unspecified, without tophus (tophi): Secondary | ICD-10-CM

## 2018-09-06 DIAGNOSIS — E1165 Type 2 diabetes mellitus with hyperglycemia: Secondary | ICD-10-CM | POA: Diagnosis not present

## 2018-09-06 DIAGNOSIS — E8881 Metabolic syndrome: Secondary | ICD-10-CM

## 2018-09-06 LAB — POCT GLYCOSYLATED HEMOGLOBIN (HGB A1C): Hemoglobin A1C: 7.4 % — AB (ref 4.0–5.6)

## 2018-09-06 NOTE — Patient Instructions (Signed)
See if you can pick up your exercise and lose a few more pounds.  I feel you can get A1C to goal with that.

## 2018-09-06 NOTE — Progress Notes (Signed)
  Subjective:     Patient ID: Jared Byrd, male   DOB: 06-26-1951, 68 y.o.   MRN: [id number]  HPI Patient seen for medical follow-up  Type 2 diabetes.  History of suboptimal control.  Currently remains on metformin and Jardiance.  We had discussed possible GLP-1 medication last visit but he wished to try diet and lifestyle.  Last A1c 7.6% which is down to 7.4% today.  No regular exercise.  Not monitoring blood sugars at home.  No side effects from medication.  His weight is down a couple pounds from last visit.  History of gout.  No recent flareups.  No history of tophaceous changes.  He has metabolic syndrome.  He remains on atorvastatin for hyperlipidemia.  No recent chest pains  Past Medical History:  Diagnosis Date  . COLONIC POLYPS, HX OF 02/03/2009  . DIAB W/O MENTION COMP TYPE II/UNS TYPE UNCNTRL 02/03/2009  . GERD 02/03/2009  . HYPERTENSION 01/31/2009  . METABOLIC SYNDROME X 7/86/7672   Past Surgical History:  Procedure Laterality Date  . TONSILLECTOMY Bilateral     reports that he quit smoking about 42 years ago. His smoking use included cigarettes. He has a 5.00 pack-year smoking history. He has never used smokeless tobacco. No history on file for alcohol and drug. family history includes Cancer in his mother and another family member; Cancer (age of onset: 64) in his sister; Diabetes in his mother; Heart disease in his father and another family member; Hyperlipidemia in an other family member; Hypertension in his father and another family member; Multiple endocrine neoplasia in his father. No Known Allergies   Review of Systems  Constitutional: Negative for fatigue and unexpected weight change.  Eyes: Negative for visual disturbance.  Respiratory: Negative for cough, chest tightness and shortness of breath.   Cardiovascular: Negative for chest pain, palpitations and leg swelling.  Gastrointestinal: Negative for abdominal pain.  Endocrine: Negative for polydipsia and  polyuria.  Genitourinary: Negative for dysuria.  Neurological: Negative for dizziness, syncope, weakness, light-headedness and headaches.       Objective:   Physical Exam Constitutional:      Appearance: He is well-developed.  HENT:     Right Ear: External ear normal.     Left Ear: External ear normal.  Eyes:     Pupils: Pupils are equal, round, and reactive to light.  Neck:     Musculoskeletal: Neck supple.     Thyroid: No thyromegaly.  Cardiovascular:     Rate and Rhythm: Normal rate and regular rhythm.  Pulmonary:     Effort: Pulmonary effort is normal. No respiratory distress.     Breath sounds: Normal breath sounds. No wheezing or rales.  Neurological:     Mental Status: He is alert and oriented to person, place, and time.        Assessment:     #1 type 2 diabetes just slightly improved with A1c 7.4%  #2 hypertension stable and at goal  #3 history of gout.  No recent flareups  #4 obesity    Plan:     -We gave option of starting GLP-1 medication versus additional weight loss and 3 more months of lifestyle changes.  He prefers the latter.  If not closer to goal by next visit consider GLP-1  -We discussed that if he has future flareups of gout may wish to get him off HCTZ which could be precipitating  Eulas Post MD [facility]

## 2018-12-05 ENCOUNTER — Ambulatory Visit (INDEPENDENT_AMBULATORY_CARE_PROVIDER_SITE_OTHER): Payer: BLUE CROSS/BLUE SHIELD | Admitting: Family Medicine

## 2018-12-05 ENCOUNTER — Other Ambulatory Visit: Payer: Self-pay

## 2018-12-05 DIAGNOSIS — E1165 Type 2 diabetes mellitus with hyperglycemia: Secondary | ICD-10-CM

## 2018-12-05 NOTE — Progress Notes (Signed)
Patient ID: Jared Byrd, male   DOB: 1951-01-05, 68 y.o.   MRN: [id number]  This visit type was conducted due to national recommendations for restrictions regarding the COVID-19 pandemic in an effort to limit this patient's exposure and mitigate transmission in our community.   Virtual Visit via Telephone Note  I connected with Jared Byrd on 12/05/18 at  8:00 AM EDT by telephone and verified that I am speaking with the correct person using two identifiers.   I discussed the limitations, risks, security and privacy concerns of performing an evaluation and management service by telephone and the availability of in person appointments. I also discussed with the patient that there may be a patient responsible charge related to this service. The patient expressed understanding and agreed to proceed.  Location patient: home Location provider: work or home office Participants present for the call: patient, provider Patient did not have a visit in the prior 7 days to address this/these issue(s).   History of Present Illness: Patient has hypertension, type 2 diabetes, obesity, gout, dyslipidemia.  He is doing well with medications.  Not monitoring blood sugars.  No polyuria or polydipsia.  He states his weight is unchanged from February visit.  A1c at that point 7.4%.  He has been compliant with all medications and denies any side effects or other concerns.  No recent chest pains or dizziness.   Observations/Objective: Patient sounds cheerful and well on the phone. I do not appreciate any SOB. Speech and thought processing are grossly intact. Patient reported vitals:  Assessment and Plan: Type 2 diabetes.  History of fair control  -We have offered A1c at this time but patient would like to wait 3 more months and schedule well visit then.  We will plan to get his full set of labs at that point.  He does not need any refills at this time  Follow Up Instructions:  -3 months for physical  exam   99441 5-10 99442 11-20 9443 21-30 I did not refer this patient for an OV in the next 24 hours for this/these issue(s).  I discussed the assessment and treatment plan with the patient. The patient was provided an opportunity to ask questions and all were answered. The patient agreed with the plan and demonstrated an understanding of the instructions.   The patient was advised to call back or seek an in-person evaluation if the symptoms worsen or if the condition fails to improve as anticipated.  I provided 12 minutes of non-face-to-face time during this encounter.   Jared Littler, MD

## 2019-03-06 DIAGNOSIS — Z8371 Family history of colonic polyps: Secondary | ICD-10-CM | POA: Diagnosis not present

## 2019-03-06 DIAGNOSIS — K5730 Diverticulosis of large intestine without perforation or abscess without bleeding: Secondary | ICD-10-CM | POA: Diagnosis not present

## 2019-03-06 DIAGNOSIS — Z800 Family history of malignant neoplasm of digestive organs: Secondary | ICD-10-CM | POA: Diagnosis not present

## 2019-03-06 DIAGNOSIS — Z1211 Encounter for screening for malignant neoplasm of colon: Secondary | ICD-10-CM | POA: Diagnosis not present

## 2019-04-18 ENCOUNTER — Encounter: Payer: Self-pay | Admitting: Family Medicine

## 2019-04-18 DIAGNOSIS — D122 Benign neoplasm of ascending colon: Secondary | ICD-10-CM | POA: Diagnosis not present

## 2019-04-18 DIAGNOSIS — K635 Polyp of colon: Secondary | ICD-10-CM | POA: Diagnosis not present

## 2019-04-18 DIAGNOSIS — Z1211 Encounter for screening for malignant neoplasm of colon: Secondary | ICD-10-CM | POA: Diagnosis not present

## 2019-04-18 DIAGNOSIS — Z8371 Family history of colonic polyps: Secondary | ICD-10-CM | POA: Diagnosis not present

## 2019-04-18 DIAGNOSIS — Z800 Family history of malignant neoplasm of digestive organs: Secondary | ICD-10-CM | POA: Diagnosis not present

## 2019-05-09 ENCOUNTER — Ambulatory Visit (INDEPENDENT_AMBULATORY_CARE_PROVIDER_SITE_OTHER): Payer: Medicare HMO | Admitting: Family Medicine

## 2019-05-09 ENCOUNTER — Other Ambulatory Visit: Payer: Self-pay

## 2019-05-09 ENCOUNTER — Encounter: Payer: Self-pay | Admitting: Family Medicine

## 2019-05-09 VITALS — BP 104/62 | HR 63 | Temp 98.6°F | Ht 73.5 in | Wt 228.2 lb

## 2019-05-09 DIAGNOSIS — Z125 Encounter for screening for malignant neoplasm of prostate: Secondary | ICD-10-CM

## 2019-05-09 DIAGNOSIS — E1165 Type 2 diabetes mellitus with hyperglycemia: Secondary | ICD-10-CM | POA: Diagnosis not present

## 2019-05-09 DIAGNOSIS — Z23 Encounter for immunization: Secondary | ICD-10-CM

## 2019-05-09 DIAGNOSIS — I10 Essential (primary) hypertension: Secondary | ICD-10-CM

## 2019-05-09 DIAGNOSIS — Z0000 Encounter for general adult medical examination without abnormal findings: Secondary | ICD-10-CM

## 2019-05-09 DIAGNOSIS — E785 Hyperlipidemia, unspecified: Secondary | ICD-10-CM

## 2019-05-09 DIAGNOSIS — E1140 Type 2 diabetes mellitus with diabetic neuropathy, unspecified: Secondary | ICD-10-CM

## 2019-05-09 LAB — BASIC METABOLIC PANEL
BUN: 20 mg/dL (ref 6–23)
CO2: 31 mEq/L (ref 19–32)
Calcium: 10 mg/dL (ref 8.4–10.5)
Chloride: 97 mEq/L (ref 96–112)
Creatinine, Ser: 1.21 mg/dL (ref 0.40–1.50)
GFR: 59.52 mL/min — ABNORMAL LOW (ref >60.00)
Glucose, Bld: 168 mg/dL — ABNORMAL HIGH (ref 70–99)
Potassium: 4.3 mEq/L (ref 3.5–5.1)
Sodium: 137 mEq/L (ref 135–145)

## 2019-05-09 LAB — HEPATIC FUNCTION PANEL
ALT: 21 U/L (ref 0–53)
AST: 17 U/L (ref 0–37)
Albumin: 4.7 g/dL (ref 3.5–5.2)
Alkaline Phosphatase: 63 U/L (ref 39–117)
Bilirubin, Direct: 0.2 mg/dL (ref 0.0–0.3)
Total Bilirubin: 1.1 mg/dL (ref 0.2–1.2)
Total Protein: 7.1 g/dL (ref 6.0–8.3)

## 2019-05-09 LAB — LIPID PANEL
Cholesterol: 151 mg/dL (ref 0–200)
HDL: 41.8 mg/dL (ref >39.00)
NonHDL: 109.69
Total CHOL/HDL Ratio: 4
Triglycerides: 237 mg/dL — ABNORMAL HIGH (ref 0.0–149.0)
VLDL: 47.4 mg/dL — ABNORMAL HIGH (ref 0.0–40.0)

## 2019-05-09 LAB — LDL CHOLESTEROL, DIRECT: Direct LDL: 78 mg/dL

## 2019-05-09 LAB — PSA, MEDICARE: PSA: 1 ng/ml (ref 0.10–4.00)

## 2019-05-09 LAB — HEMOGLOBIN A1C: Hgb A1c MFr Bld: 8.1 % — ABNORMAL HIGH (ref 4.6–6.5)

## 2019-05-09 NOTE — Progress Notes (Signed)
Subjective:     Patient ID: Jared Byrd, male   DOB: 08/16/1950, 68 y.o.   MRN: [id number]  HPI   Elberta Fortis is here for physical exam.  He has chronic problems including type 2 diabetes with some mild peripheral neuropathy, hyperlipidemia, hypertension, metabolic syndrome, obesity.  Not monitoring blood sugars regularly.  He is overdue for eye exam.  Does have some mild neuropathy symptoms involving the feet.  Medications reviewed.  Compliant with all.  Health maintenance reviewed.  He needs flu vaccine.  Pneumonia vaccines up-to-date.  Tetanus up-to-date.  He just had colonoscopy couple weeks ago with recommended 5-year follow-up.  Previous hepatitis C screen negative.  Past Medical History:  Diagnosis Date  . COLONIC POLYPS, HX OF 02/03/2009  . DIAB W/O MENTION COMP TYPE II/UNS TYPE UNCNTRL 02/03/2009  . GERD 02/03/2009  . HYPERTENSION 01/31/2009  . METABOLIC SYNDROME X 0000000   Past Surgical History:  Procedure Laterality Date  . TONSILLECTOMY Bilateral     reports that he quit smoking about 43 years ago. His smoking use included cigarettes. He has a 5.00 pack-year smoking history. He has never used smokeless tobacco. No history on file for alcohol and drug. family history includes Cancer in his mother and another family member; Cancer (age of onset: 58) in his sister; Diabetes in his mother; Heart disease in his father and another family member; Hyperlipidemia in an other family member; Hypertension in his father and another family member; Multiple endocrine neoplasia in his father. No Known Allergies   Review of Systems  Constitutional: Negative for activity change, appetite change, fatigue, fever and unexpected weight change.  HENT: Negative for congestion, ear pain and trouble swallowing.   Eyes: Negative for pain and visual disturbance.  Respiratory: Negative for cough, shortness of breath and wheezing.   Cardiovascular: Negative for chest pain and palpitations.   Gastrointestinal: Negative for abdominal distention, abdominal pain, blood in stool, constipation, diarrhea, nausea, rectal pain and vomiting.  Endocrine: Negative for polydipsia and polyuria.  Genitourinary: Negative for dysuria, hematuria and testicular pain.  Musculoskeletal: Negative for arthralgias and joint swelling.  Skin: Negative for rash.  Neurological: Negative for dizziness, syncope and headaches.  Hematological: Negative for adenopathy.  Psychiatric/Behavioral: Negative for confusion and dysphoric mood.       Objective:   Physical Exam Constitutional:      General: He is not in acute distress.    Appearance: He is well-developed.  HENT:     Head: Normocephalic and atraumatic.     Right Ear: External ear normal.     Left Ear: External ear normal.  Eyes:     Conjunctiva/sclera: Conjunctivae normal.     Pupils: Pupils are equal, round, and reactive to light.  Neck:     Musculoskeletal: Normal range of motion and neck supple.     Thyroid: No thyromegaly.  Cardiovascular:     Rate and Rhythm: Normal rate and regular rhythm.     Heart sounds: Normal heart sounds. No murmur.  Pulmonary:     Effort: No respiratory distress.     Breath sounds: No wheezing or rales.  Abdominal:     General: Bowel sounds are normal. There is no distension.     Palpations: Abdomen is soft. There is no mass.     Tenderness: There is no abdominal tenderness. There is no guarding or rebound.  Lymphadenopathy:     Cervical: No cervical adenopathy.  Skin:    Findings: No rash.     Comments: Feet reveal  no lesions.  No callus.  Good distal pulses.  He does have some mild sensory impairment monofilament testing  Neurological:     Mental Status: He is alert and oriented to person, place, and time.     Cranial Nerves: No cranial nerve deficit.     Deep Tendon Reflexes: Reflexes normal.        Assessment:     Physical exam.  He has chronic problems include type 2 diabetes, hyperlipidemia,  hypertension.  His blood pressure is well controlled.  Overdue for labs.    Plan:     -Flu vaccine given -Obtain follow-up labs including basic metabolic panel, lipid panel, hepatic panel, A1c -The natural history of prostate cancer and ongoing controversy regarding screening and potential treatment outcomes of prostate cancer has been discussed with the patient. The meaning of a false positive PSA and a false negative PSA has been discussed. He indicates understanding of the limitations of this screening test and wishes  to proceed with screening PSA testing. -Needs to set up diabetic eye exam and he will do so -Strongly encouraged to monitor blood sugars more regularly.  Consider GLP-1 medication if A1c not further to goal  Eulas Post MD [facility] at [facility]

## 2019-05-09 NOTE — Patient Instructions (Signed)
Set up eye exam  We will call you with lab results.

## 2019-05-15 ENCOUNTER — Ambulatory Visit: Payer: Self-pay | Admitting: *Deleted

## 2019-05-15 DIAGNOSIS — Z0000 Encounter for general adult medical examination without abnormal findings: Secondary | ICD-10-CM

## 2019-05-15 MED ORDER — LISINOPRIL 40 MG PO TABS: 40.0000 mg | ORAL_TABLET | Freq: Every day | ORAL | 1 refills | Status: DC

## 2019-05-15 MED ORDER — TRULICITY 0.75 MG/0.5ML ~~LOC~~ SOAJ: 0.7500 mg | SUBCUTANEOUS | 2 refills | Status: DC

## 2019-05-15 MED ORDER — JARDIANCE 10 MG PO TABS: 10.0000 mg | ORAL_TABLET | Freq: Every day | ORAL | 1 refills | Status: DC

## 2019-05-15 MED ORDER — HYDROCHLOROTHIAZIDE 25 MG PO TABS: 12.5000 mg | ORAL_TABLET | Freq: Every day | ORAL | 1 refills | Status: DC

## 2019-05-15 NOTE — Addendum Note (Signed)
Addended by: Benson Setting L on: 05/15/2019 02:36 PM   Modules accepted: Orders

## 2019-05-15 NOTE — Telephone Encounter (Signed)
Should continue with Metformin and Jardiance.

## 2019-05-15 NOTE — Telephone Encounter (Signed)
Contacted pt to give results from CPE; he was given results per Dr Elease Hashimoto, "Labs all stable except that A1C has climbed up to 8.1%. Recommend start Trulicity [id number] mg Bath once weekly and make sure we have 3 month follow up; he will use mail order from [facility]; the pt would like clarification on if he should continue his metformin and jardiance; he would also like for medications to be sent to [place]: HCTZ, Lisinopril, Metoprolol, Atorvastatin, Metformin, and Jardiance; explained to pt that for the quickest response, he can contact the pharmacy so that they can send an electronic request for medication, the pt verbalized understanding; will also forward for final disposition; pt transferred to [facility] for scheduling.   Reason for Disposition . Health Information question, no triage required and triager able to answer question  Answer Assessment - Initial Assessment Questions 1. REASON FOR CALL or QUESTION: "What is your reason for calling today?" or "How can I best help you?" or "What question do you have that I can help answer?"     Lab results from CPE  Protocols used: INFORMATION ONLY CALL-A-AH

## 2019-05-15 NOTE — Telephone Encounter (Signed)
Attempted to reach pt, no answer. Left vm informing him that new rx was sent to pharmacy and to continue his two medications.

## 2019-05-24 ENCOUNTER — Telehealth: Payer: Self-pay | Admitting: Family Medicine

## 2019-05-24 ENCOUNTER — Other Ambulatory Visit: Payer: Self-pay | Admitting: Family Medicine

## 2019-05-24 MED ORDER — METOPROLOL SUCCINATE ER 50 MG PO TB24: ORAL_TABLET | ORAL | 0 refills | Status: DC

## 2019-05-24 MED ORDER — HYDROCHLOROTHIAZIDE 25 MG PO TABS: 12.5000 mg | ORAL_TABLET | Freq: Every day | ORAL | 0 refills | Status: DC

## 2019-05-24 MED ORDER — ATORVASTATIN CALCIUM 10 MG PO TABS: 10.0000 mg | ORAL_TABLET | Freq: Every day | ORAL | 0 refills | Status: DC

## 2019-05-24 MED ORDER — METFORMIN HCL 500 MG PO TABS: ORAL_TABLET | ORAL | 1 refills | Status: DC

## 2019-05-24 NOTE — Telephone Encounter (Signed)
According to nurse triage note on 05/15/19 pt is to continue metformin. Refills sent to mail order. Spoke with wife on ([place]) who states that they are using [facility] just for a better cost for medication.

## 2019-05-24 NOTE — Telephone Encounter (Signed)
Jared Byrd is calling about a script they sent over for metFORMIN (GLUCOPHAGE) 500 MG tablet not listed pharm, not refilled since Jan. Return call at [phone] [name]

## 2019-07-31 ENCOUNTER — Other Ambulatory Visit: Payer: Self-pay | Admitting: Family Medicine

## 2019-08-15 ENCOUNTER — Ambulatory Visit (INDEPENDENT_AMBULATORY_CARE_PROVIDER_SITE_OTHER): Payer: Medicare HMO | Admitting: Family Medicine

## 2019-08-15 ENCOUNTER — Other Ambulatory Visit: Payer: Self-pay

## 2019-08-15 ENCOUNTER — Encounter: Payer: Self-pay | Admitting: Family Medicine

## 2019-08-15 VITALS — BP 110/76 | HR 72 | Temp 97.7°F | Ht 73.5 in | Wt 219.7 lb

## 2019-08-15 DIAGNOSIS — E1140 Type 2 diabetes mellitus with diabetic neuropathy, unspecified: Secondary | ICD-10-CM | POA: Diagnosis not present

## 2019-08-15 LAB — POCT GLYCOSYLATED HEMOGLOBIN (HGB A1C): Hemoglobin A1C: 6.9 % — AB (ref 4.0–5.6)

## 2019-08-15 NOTE — Patient Instructions (Signed)
A1C is improved to 6.9% and now at goal.   Lets plan on 6 month follow up.

## 2019-08-15 NOTE — Progress Notes (Signed)
  Subjective:     Patient ID: Jared Byrd, male   DOB: 18-Apr-1951, 69 y.o.   MRN: [id number]  HPI Jared Byrd is seen for follow-up after initiation of Trulicity last visit.  His A1c was 8.1%.  He had some mild queasiness when he first started the first week but has had none whatsoever since then.  Tolerating well.  He is not monitoring blood sugars regularly.  No polyuria or polydipsia.  He also remains on Jardiance and Metformin.  He had recent elevated triglycerides but cholesterol is fairly well controlled.  He is lost about 9 pounds since starting the Trulicity  Past Medical History:  Diagnosis Date  . COLONIC POLYPS, HX OF 02/03/2009  . DIAB W/O MENTION COMP TYPE II/UNS TYPE UNCNTRL 02/03/2009  . GERD 02/03/2009  . HYPERTENSION 01/31/2009  . METABOLIC SYNDROME X 0000000   Past Surgical History:  Procedure Laterality Date  . TONSILLECTOMY Bilateral     reports that he quit smoking about 43 years ago. His smoking use included cigarettes. He has a 5.00 pack-year smoking history. He has never used smokeless tobacco. No history on file for alcohol and drug. family history includes Cancer in his mother and another family member; Cancer (age of onset: 33) in his sister; Diabetes in his mother; Heart disease in his father and another family member; Hyperlipidemia in an other family member; Hypertension in his father and another family member; Multiple endocrine neoplasia in his father. No Known Allergies   Review of Systems  Constitutional: Negative for fatigue.  Eyes: Negative for visual disturbance.  Respiratory: Negative for cough, chest tightness and shortness of breath.   Cardiovascular: Negative for chest pain, palpitations and leg swelling.  Endocrine: Negative for polydipsia and polyuria.  Neurological: Negative for dizziness, syncope, weakness, light-headedness and headaches.       Objective:   Physical Exam Constitutional:      General: He is not in acute distress.  Appearance: He is well-developed.  Neck:     Thyroid: No thyromegaly.  Cardiovascular:     Rate and Rhythm: Normal rate and regular rhythm.     Heart sounds: Normal heart sounds. No murmur.  Pulmonary:     Effort: Pulmonary effort is normal. No respiratory distress.     Breath sounds: Normal breath sounds. No wheezing or rales.  Skin:    Findings: No rash.  Neurological:     Mental Status: He is alert.     Deep Tendon Reflexes: Reflexes are normal and symmetric.        Assessment:     Type 2 diabetes with improving control.  His A1c is reduced from 8.1% to 6.9% with recent addition of Trulicity    Plan:     -Continue current regimen.  We recommended 59-month follow-up. -Hopefully can continue with weight loss efforts  Jared Post MD [facility] at [facility]

## 2019-09-03 ENCOUNTER — Telehealth: Payer: Self-pay | Admitting: Family Medicine

## 2019-09-03 NOTE — Telephone Encounter (Signed)
Left message for patient to call back and schedule Medicare Annual Wellness Visit (AWV) either virtually,audio only or in person (whichever the patient prefers--45 MINUTES).  No hx; please schedule at anytime with [facility]-Nurse Health Advisor at [facility] [place].

## 2019-09-14 ENCOUNTER — Other Ambulatory Visit: Payer: Self-pay

## 2019-09-14 ENCOUNTER — Other Ambulatory Visit: Payer: Self-pay | Admitting: Family Medicine

## 2019-09-14 DIAGNOSIS — Z0000 Encounter for general adult medical examination without abnormal findings: Secondary | ICD-10-CM

## 2019-09-17 ENCOUNTER — Encounter: Payer: Self-pay | Admitting: Family Medicine

## 2019-09-17 ENCOUNTER — Ambulatory Visit (INDEPENDENT_AMBULATORY_CARE_PROVIDER_SITE_OTHER): Payer: Medicare HMO | Admitting: Family Medicine

## 2019-09-17 ENCOUNTER — Other Ambulatory Visit: Payer: Self-pay

## 2019-09-17 DIAGNOSIS — Z0000 Encounter for general adult medical examination without abnormal findings: Secondary | ICD-10-CM | POA: Diagnosis not present

## 2019-09-17 MED ORDER — TRULICITY 0.75 MG/0.5ML ~~LOC~~ SOAJ: 0.7500 mg | SUBCUTANEOUS | 3 refills | Status: DC

## 2019-09-17 NOTE — Progress Notes (Signed)
This visit type was conducted due to national recommendations for restrictions regarding the COVID-19 pandemic in an effort to limit this patient's exposure and mitigate transmission in our community.   Virtual Visit via Telephone Note  I connected with Jared Byrd on 09/17/19 at  8:30 AM EST by telephone and verified that I am speaking with the correct person using two identifiers.   I discussed the limitations, risks, security and privacy concerns of performing an evaluation and management service by telephone and the availability of in person appointments. I also discussed with the patient that there may be a patient responsible charge related to this service. The patient expressed understanding and agreed to proceed.  Location patient: home Location provider: work or home office Participants present for the call: patient, provider Patient did not have a visit in the prior 7 days to address this/these issue(s).   History of Present Illness:  Jared Byrd had connected with [place] regarding subsequent Medicare annual wellness visit  He has type 2 diabetes and had suboptimal control until we recently added Trulicity and his [id number] improved from 8.1 to 6.9%.  He is overdue for eye exam.  His other medical problems include hypertension, history of GERD, history of benign colon polyps, dyslipidemia, gout, metabolic syndrome X.  He does need refills of Trulicity.  Apparently other medications are up-to-date.  1.  Risk factors based on Past Medical , Social, and Family history reviewed and as indicated above with no changes  2.  Limitations in physical activities None.  No recent falls.  He does not have any significant balance problems.  No consistent exercise.  3.  Depression/mood No active depression or anxiety issues PHQ 2 equals 0  4.  Hearing No deficits  5.  ADLs independent in all.  6.  Cognitive function (orientation to time and place, language, writing, speech,memory) no short or long  term memory issues.  Language and judgement intact.  Short-term recall is good  7.  Home Safety no issues  8.  Height, weight, and visual acuity.all stable.  He is overdue for eye exam with his diabetes status and is aware of indications to get this yearly.  He is in process of setting up.  He has lost some weight since starting Jardiance and Trulicity  Wt Readings from Last 3 Encounters:  08/15/19 219 lb 11.2 oz (99.7 kg)  05/09/19 228 lb 3.2 oz (103.5 kg)  09/06/18 230 lb 8 oz (104.6 kg)    9.  Counseling discussed Counseled regarding age and gender appropriate preventative screenings and immunizations.  10. Recommendation of preventive services.  We asked that he confirm with pharmacy if he got second Shingrix vaccine.  As above, needs follow-up eye exam.  He is undecided regarding COVID-19 vaccine  11. Labs based on risk factors-none indicated at this time.  Recent labs reviewed with patient  12. Care Plan-as below  13. Other Providers-Dr. Juanita Craver, gastroenterology  14. Written schedule of screening/prevention services given to patient. Health Maintenance  Topic Date Due  . OPHTHALMOLOGY EXAM  03/23/2019  . HEMOGLOBIN A1C  02/12/2020  . FOOT EXAM  05/08/2020  . TETANUS/TDAP  01/16/2022  . COLONOSCOPY  04/17/2029  . INFLUENZA VACCINE  Completed  . Hepatitis C Screening  Completed  . PNA vac Low Risk Adult  Completed       Observations/Objective: Patient sounds cheerful and well on the phone. I do not appreciate any SOB. Speech and thought processing are grossly intact. Patient reported vitals:  Assessment  and Plan: Medicare subsequent annual wellness visit.  -Continue with annual flu vaccine -Pneumonia vaccines already given -Has had both Zostavax and Shingrix vaccines -Undecided regarding COVID-19 vaccine.  He is still considering -Continue weight loss efforts -Routine follow-up in 6 months and recheck A1c then -Refilled Trulicity for 1 year  Follow Up  Instructions:  -As above   99441 5-10 99442 11-20 99443 21-30 I did not refer this patient for an OV in the next 24 hours for this/these issue(s).  I discussed the assessment and treatment plan with the patient. The patient was provided an opportunity to ask questions and all were answered. The patient agreed with the plan and demonstrated an understanding of the instructions.   The patient was advised to call back or seek an in-person evaluation if the symptoms worsen or if the condition fails to improve as anticipated.  I provided 26 minutes of non-face-to-face time during this encounter.   Carolann Littler, MD

## 2019-11-20 ENCOUNTER — Telehealth: Payer: Self-pay | Admitting: Family Medicine

## 2019-11-20 NOTE — Chronic Care Management (AMB) (Signed)
  Chronic Care Management   Outreach Note  11/20/2019 Name: Jared Byrd MRN: [id number] DOB: 1950/09/15  Referred by: Eulas Post, MD Reason for referral : Chronic Care Management   An unsuccessful telephone outreach was attempted today. The patient was referred to the pharmacist for assistance with care management and care coordination.   Follow Up Plan:   [place]

## 2019-11-21 ENCOUNTER — Telehealth: Payer: Self-pay | Admitting: Family Medicine

## 2019-11-21 DIAGNOSIS — I10 Essential (primary) hypertension: Secondary | ICD-10-CM

## 2019-11-21 DIAGNOSIS — E1140 Type 2 diabetes mellitus with diabetic neuropathy, unspecified: Secondary | ICD-10-CM

## 2019-11-21 NOTE — Chronic Care Management (AMB) (Signed)
  Chronic Care Management   Note  11/21/2019 Name: Ramses Lengle MRN: [id number] DOB: Nov 30, 1950  Zaiah Rua is a 69 y.o. year old male who is a primary care patient of Burchette, Alinda Sierras, MD. I reached out to [name] by phone today in response to a referral sent by Mr. Iason Dolecki's PCP, Eulas Post, MD.   Mr. Matheson was given information about Chronic Care Management services today including:  1. CCM service includes personalized support from designated clinical staff supervised by his physician, including individualized plan of care and coordination with other care providers 2. 24/7 contact phone numbers for assistance for urgent and routine care needs. 3. Service will only be billed when office clinical staff spend 20 minutes or more in a month to coordinate care. 4. Only one practitioner may furnish and bill the service in a calendar month. 5. The patient may stop CCM services at any time (effective at the end of the month) by phone call to the office staff.   Patient agreed to services and verbal consent obtained.   Follow up plan:   [place]

## 2019-12-20 NOTE — Chronic Care Management (AMB) (Signed)
Chronic Care Management Pharmacy  Name: Jared Byrd  MRN: [id number] DOB: 1950/12/16   Chief Complaint/ HPI  Jared Byrd,  69 y.o. , male presents for their Initial CCM visit with the clinical pharmacist In office. 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 He is happily married since 2006. Him and his wife are active at [facility] in [place], [place]. His wife is retired since her job was affected by COVID and she is now helping out her younger brother with kidney and liver transplant. He is doing well as of now and no immediate concerns with his health.  PCP : Jared Post, MD  Their chronic conditions include: HTN, HLD, type 2 diabetes  Office Visits: 09/17/19 OV - Medicare annual wellness visit. Patient is doing well. A1c dropped from 8.1% to to 3.8% when Trulicity was added to the regimen. No changes with other meds.  Consult Visit: None  Medications: Outpatient Encounter Medications as of 12/25/2019  Medication Sig  . Ascorbic Acid (VITAMIN C) 1000 MG tablet Take 1,000 mg by mouth daily.  Marland Kitchen aspirin 81 MG tablet Take 81 mg by mouth daily.    Marland Kitchen atorvastatin (LIPITOR) 10 MG tablet TAKE 1 TABLET EVERY DAY  . Blood Glucose Monitoring Suppl (ONETOUCH VERIO IQ SYSTEM) W/DEVICE KIT Use as instructed.  Marland Kitchen CO-ENZYME Q-10 PO Take 200 mg by mouth daily.   . Dulaglutide (TRULICITY) 7.56 EP/3.2RJ SOPN Inject 0.75 mg into the skin once a week.  . hydrochlorothiazide (HYDRODIURIL) 25 MG tablet Take 0.5 tablets (12.5 mg total) by mouth daily.  Marland Kitchen JARDIANCE 10 MG TABS tablet TAKE 1 TABLET EVERY DAY  . lisinopril (ZESTRIL) 40 MG tablet TAKE 1 TABLET EVERY DAY  . metFORMIN (GLUCOPHAGE) 500 MG tablet TAKE 2 TABLETS TWICE A DAY WITH MEALS  . metoprolol succinate (TOPROL-XL) 50 MG 24 hr tablet TAKE 1 TABLET DAILY WITH OR IMMEDIATELY FOLLOWING A MEAL  . Multiple Vitamin (MULTIVITAMIN) tablet Take 1 tablet by mouth  daily. One A Day Mens  . indomethacin (INDOCIN) 50 MG capsule TAKE 1 CAPSULE (50 MG TOTAL) BY MOUTH 3 (THREE) TIMES DAILY AS NEEDED. (Patient not taking: Reported on 12/25/2019)   Facility-Administered Encounter Medications as of 12/25/2019  Medication  . zoster vaccine live (PF) (ZOSTAVAX) injection 19,400 Units      Transportation Needs: No Transportation Needs  . Lack of Transportation (Medical): No  . Lack of Transportation (Non-Medical): No      Physical Activity: Inactive  . Days of Exercise per Week: 0 days  . Minutes of Exercise per Session: 0 min    Current Diagnosis/Assessment:  Goals Addressed            This Visit's Progress   . Chronic Care Management       CARE PLAN ENTRY  Current Barriers:  . Chronic Disease Management support, education, and care coordination needs related to Hypertension, Hyperlipidemia, and Diabetes   Hypertension . Pharmacist Clinical Goal(s): o Over the next 90 days, patient will work with PharmD and providers to maintain BP goal <140/90 . Current regimen:  . HCTZ 25 mg 0.5 tablet once daily . Lisinopril 40 mg 1 tablet once daily . Metoprolol succinate 50 mg 1 tablet once daily with or immediately following a meal . Interventions: o Discussed the importance of routine blood pressure monitoring o Discussed the benefits of heart healthy diet and low salt intake . Patient self  care activities - Over the next 90 days, patient will: o Check BP once a week, document, and provide at future appointments o Ensure daily salt intake < 2300 mg/day  Hyperlipidemia . Pharmacist Clinical Goal(s): o Over the next 90 days, patient will work with PharmD and providers to maintain LDL goal < 100 . Current regimen:  o Atorvastatin 10 mg 1 tablet once daily . Interventions: o Advised the patient to take statin at bedtime optimal results . Patient self care activities - Over the next 90 days, patient will: o Take atorvastatin 10 mg daily at  bedtime  Diabetes . Pharmacist Clinical Goal(s): o Over the next 90 days, patient will work with PharmD and providers to maintain A1c goal <7% . Current regimen:  . Trulicity 8.27 MB/8.6 ml inject 0.75 mg into the skin once a week . Jardiance 10 mg 1 tablet once daily  . Metformin 500 mg 2 tablets twice a day with meal Interventions: o Discussed the benefits of lowering A1c further when it comes to cost savings o Advised to check fasting blood sugar every morning . Patient self care activities - Over the next 90 days, patient will: o Check blood sugar in the morning before eating or drinking, document, and provide at future appointments o Contact provider with any episodes of hypoglycemia o Continue to eat portion size meals and cut down on sugar and carbohydrates  Medication management . Pharmacist Clinical Goal(s): o Over the next 90 days, patient will work with PharmD and providers to maintain optimal medication adherence . Current pharmacy: [facility] . Interventions o Comprehensive medication review performed. o Continue current medication management strategy . Patient self care activities - Over the next 90 days, patient will: o Take medications as prescribed o Report any questions or concerns to PharmD and/or provider(s)  Initial goal documentation        Hypertension   Office blood pressures are  BP Readings from Last 3 Encounters:  08/15/19 110/76  05/09/19 104/62  09/06/18 114/68    Patient has failed these meds in the past: None Patient is currently controlled on the following medications:  . HCTZ 25 mg 0.5 tablet once daily . Lisinopril 40 mg 1 tablet once daily . Metoprolol succinate 50 mg 1 tablet once daily with or immediately following a meal  Patient checks BP at home infrequently  Discussed with patient the importance of routine blood pressure monitoring. Patient's BP has been immaculate and under the goal of < 140/90. Advised to at least  check his blood pressure once a week to keep him in check. Patient reports eating heart healthy diet including vegetables, lean meats, and low salt diet. Patient has no routine exercise established in place, but he keeps himself moving with his work.  Plan  Continue current medications and lifestyle modifications  Dyslipidemia   Lipid Panel     Component Value Date/Time   CHOL 151 05/09/2019 0835   TRIG 237.0 (H) 05/09/2019 0835   HDL 41.80 05/09/2019 0835   CHOLHDL 4 05/09/2019 0835   VLDL 47.4 (H) 05/09/2019 0835   LDLCALC 47 02/23/2016 0934   LDLDIRECT 78.0 05/09/2019 0835     The 10-year ASCVD risk score Mikey Bussing DC Jr., et al., 2013) is: 25.7%   Values used to calculate the score:     Age: 50 years     Sex: Male     Is Non-Hispanic African American: No     Diabetic: Yes     Tobacco smoker: No  Systolic Blood Pressure: 388 mmHg     Is BP treated: Yes     HDL Cholesterol: 41.8 mg/dL     Total Cholesterol: 151 mg/dL   Patient has failed these meds in past: None Patient is currently controlled on the following medications:  . Atorvastatin 10 mg 1 tablet once daily  Patient reports heart healthy diet mentioned above. Advised patient to take statin at bedtime instead of morning. Will have annual visit next month and we'll have a current picture of his lipid panel.  Plan  Continue current medications and lifestyle modifications Recheck lipid panel on his next follow-up visit  Diabetes   Recent Relevant Labs: Lab Results  Component Value Date/Time   HGBA1C 6.9 (A) 08/15/2019 07:22 AM   HGBA1C 8.1 (H) 05/09/2019 08:35 AM   HGBA1C 7.4 (A) 09/06/2018 07:47 AM   HGBA1C 7.6 (H) 06/07/2018 07:26 AM   GFR 59.52 (L) 05/09/2019 08:35 AM   GFR 68.65 06/07/2018 07:26 AM   MICROALBUR <0.7 01/24/2015 08:24 AM   MICROALBUR 0.2 01/23/2014 09:16 AM     Checking BG: Rarely  Patient has failed these meds in past: None Patient is currently controlled on the following  medications:  . Trulicity 8.28 MK/3.4 ml inject 0.75 mg into the skin once a week . Jardiance 10 mg 1 tablet once daily  . Metformin 500 mg 2 tablets twice a day with meals  Last diabetic Eye exam:  Lab Results  Component Value Date/Time   HMDIABEYEEXA No Retinopathy 03/22/2018 12:00 AM    Last diabetic Foot exam:  Lab Results  Component Value Date/Time   HMDIABFOOTEX  02/23/2016 12:00 AM    Mild impairment monofilament testing ventral surface of several toes otherwise normal exam     Patient is within goal. Patient's A1c is under 7.0%. Encouraged continuing lifestyle modifications to take him off medications like trulicity and jardiance (tier 3) which costs a lot monthly. Patient acknowledged and understood the plan. Advised patient to at least check FBG every morning before breakfast so he has an idea of where his blood sugar is to begin with that day.  Plan  Continue current medications and lifestyle modification Recheck A1c on his next follow-up visit  Gout   Patient has failed these meds in past: None Patient is currently controlled on the following medications:  . Indomethacin 50 mg 1 capsule 3 times daily PRN  Patient reports no flares for 1-2 years. Patient reports that he has not taken his indomethacin for a long time and probably expired now. D/C indomethacin on his active med list.  Plan  Recommend taking off gout on his active problem list Recommend discontinuing indomethacin on his active med list   OTCs/Health Maintenance   Patient is currently controlled on the following medications: . Aspirin 81 mg 1 tablet daily . Onetouch Verio IQ system . Vitamin C 1000 mg 1 tablet daily . Fish oil 1000 mg 1 capsule daily . CoQ10 200 mg 1 capsule daily . One A Day Mens Multivitamin 1 tablet daily  Plan  Continue current medications  Vaccines   Reviewed and discussed patient's vaccination history.    Immunization History  Administered Date(s) Administered  .  Fluad Quad(high Dose 65+) 05/09/2019  . Influenza Split 05/04/2011, 04/13/2014  . Influenza, High Dose Seasonal PF 05/12/2017  . Influenza-Unspecified 05/01/2015, 04/24/2016  . Pneumococcal Conjugate-13 02/23/2016  . Pneumococcal Polysaccharide-23 01/17/2012, 03/03/2018  . Td 02/04/2006  . Tdap 01/17/2012  . Zoster 01/17/2012  . Zoster Recombinat (Shingrix)  03/26/2017   Patient reported second dose of COVID vaccine on 12/26/19.  Plan  Immunization is current.  Medication Management   Pharmacy/Benefits: [facility] in [place], [place] / [facility] endorses 100% compliance  Discussed the benefits of [facility], but patient has lower copays with [employer]. Advised patient that this is an option for him in the future.  Plan  Continue current medication management strategy   Follow up: 3 month phone visit  Geraldine Contras, PharmD Clinical Pharmacist [facility] [place] [phone]

## 2019-12-25 ENCOUNTER — Other Ambulatory Visit: Payer: Self-pay

## 2019-12-25 ENCOUNTER — Ambulatory Visit: Payer: Medicare HMO | Admitting: Pharmacist

## 2019-12-25 DIAGNOSIS — E1140 Type 2 diabetes mellitus with diabetic neuropathy, unspecified: Secondary | ICD-10-CM

## 2019-12-25 DIAGNOSIS — I10 Essential (primary) hypertension: Secondary | ICD-10-CM

## 2019-12-25 DIAGNOSIS — E785 Hyperlipidemia, unspecified: Secondary | ICD-10-CM

## 2019-12-25 NOTE — Addendum Note (Signed)
Addended by: Modena Morrow R on: 12/25/2019 11:07 AM   Modules accepted: Orders

## 2019-12-25 NOTE — Patient Instructions (Addendum)
Visit Information  It was a pleasure meeting with you today, Mr. Jared Byrd! I am looking forward to helping your medications and with everything actually. I am excited to see your progress. Always keep your eyes on the goal! Remember, baby steps. Below is a summary of what we talked about during the visit:  Goals Addressed            This Visit's Progress   . Chronic Care Management       CARE PLAN ENTRY  Current Barriers:  . Chronic Disease Management support, education, and care coordination needs related to Hypertension, Hyperlipidemia, and Diabetes   Hypertension . Pharmacist Clinical Goal(s): o Over the next 90 days, patient will work with PharmD and providers to maintain BP goal <140/90 . Current regimen:  . HCTZ 25 mg 0.5 tablet once daily . Lisinopril 40 mg 1 tablet once daily . Metoprolol succinate 50 mg 1 tablet once daily with or immediately following a meal . Interventions: o Discussed the importance of routine blood pressure monitoring o Discussed the benefits of heart healthy diet and low salt intake . Patient self care activities - Over the next 90 days, patient will: o Check BP once a week, document, and provide at future appointments o Ensure daily salt intake < 2300 mg/day  Hyperlipidemia . Pharmacist Clinical Goal(s): o Over the next 90 days, patient will work with PharmD and providers to maintain LDL goal < 100 . Current regimen:  o Atorvastatin 10 mg 1 tablet once daily . Interventions: o Advised the patient to take statin at bedtime optimal results . Patient self care activities - Over the next 90 days, patient will: o Take atorvastatin 10 mg daily at bedtime  Diabetes . Pharmacist Clinical Goal(s): o Over the next 90 days, patient will work with PharmD and providers to maintain A1c goal <7% . Current regimen:  . Trulicity 0.27 XA/1.2 ml inject 0.75 mg into the skin once a week . Jardiance 10 mg 1 tablet once daily  . Metformin 500 mg 2 tablets twice  a day with meal Interventions: o Discussed the benefits of lowering A1c further when it comes to cost savings o Advised to check fasting blood sugar every morning . Patient self care activities - Over the next 90 days, patient will: o Check blood sugar in the morning before eating or drinking, document, and provide at future appointments o Contact provider with any episodes of hypoglycemia o Continue to eat portion size meals and cut down on sugar and carbohydrates  Medication management . Pharmacist Clinical Goal(s): o Over the next 90 days, patient will work with PharmD and providers to maintain optimal medication adherence . Current pharmacy: [facility] . Interventions o Comprehensive medication review performed. o Continue current medication management strategy . Patient self care activities - Over the next 90 days, patient will: o Take medications as prescribed o Report any questions or concerns to PharmD and/or provider(s)  Initial goal documentation        Mr. Jared Byrd was given information about Chronic Care Management services today including:  1. CCM service includes personalized support from designated clinical staff supervised by his physician, including individualized plan of care and coordination with other care providers 2. 24/7 contact phone numbers for assistance for urgent and routine care needs. 3. Standard insurance, coinsurance, copays and deductibles apply for chronic care management only during months in which we provide at least 20 minutes of these services. Most insurances cover these services at 100%, however patients  may be responsible for any copay, coinsurance and/or deductible if applicable. This service may help you avoid the need for more expensive face-to-face services. 4. Only one practitioner may furnish and bill the service in a calendar month. 5. The patient may stop CCM services at any time (effective at the end of the month) by phone call to  the office staff.  Patient agreed to services and verbal consent obtained.   The patient verbalized understanding of instructions provided today and agreed to receive a mailed copy of patient instruction and/or educational materials. Telephone follow up appointment with pharmacy team member scheduled for: 03/27/20 at 1 PM  Geraldine Contras, PharmD Clinical Pharmacist [facility] [phone]    Cholesterol Content in Foods Cholesterol is a waxy, fat-like substance that helps to carry fat in the blood. The body needs cholesterol in small amounts, but too much cholesterol can cause damage to the arteries and heart. Most people should eat less than 200 milligrams (mg) of cholesterol a day. Foods with cholesterol  Cholesterol is found in animal-based foods, such as meat, seafood, and dairy. Generally, low-fat dairy and lean meats have less cholesterol than full-fat dairy and fatty meats. The milligrams of cholesterol per serving (mg per serving) of common cholesterol-containing foods are listed below. Meat and other proteins  Egg -- one large whole egg has 186 mg.  Veal shank -- 4 oz has 141 mg.  Lean ground [place] (93% lean) -- 4 oz has 118 mg.  Fat-trimmed lamb loin -- 4 oz has 106 mg.  Lean ground beef (90% lean) -- 4 oz has 100 mg.  Lobster -- 3.5 oz has 90 mg.  Pork loin chops -- 4 oz has 86 mg.  Canned salmon -- 3.5 oz has 83 mg.  Fat-trimmed beef top loin -- 4 oz has 78 mg.  Frankfurter -- 1 frank (3.5 oz) has 77 mg.  Crab -- 3.5 oz has 71 mg.  Roasted chicken without skin, white meat -- 4 oz has 66 mg.  Light bologna -- 2 oz has 45 mg.  Deli-cut [place] -- 2 oz has 31 mg.  Canned tuna -- 3.5 oz has 31 mg.  Berniece Salines -- 1 oz has 29 mg.  Oysters and mussels (raw) -- 3.5 oz has 25 mg.  Mackerel -- 1 oz has 22 mg.  Trout -- 1 oz has 20 mg.  Pork sausage -- 1 link (1 oz) has 17 mg.  Salmon -- 1 oz has 16 mg.  Tilapia -- 1 oz has 14  mg. Dairy  Soft-serve ice cream --  cup (4 oz) has 103 mg.  Whole-milk yogurt -- 1 cup (8 oz) has 29 mg.  Cheddar cheese -- 1 oz has 28 mg.  American cheese -- 1 oz has 28 mg.  Whole milk -- 1 cup (8 oz) has 23 mg.  2% milk -- 1 cup (8 oz) has 18 mg.  Cream cheese -- 1 tablespoon (Tbsp) has 15 mg.  Cottage cheese --  cup (4 oz) has 14 mg.  Low-fat (1%) milk -- 1 cup (8 oz) has 10 mg.  Sour cream -- 1 Tbsp has 8.5 mg.  Low-fat yogurt -- 1 cup (8 oz) has 8 mg.  Nonfat Greek yogurt -- 1 cup (8 oz) has 7 mg.  Half-and-half cream -- 1 Tbsp has 5 mg. Fats and oils  Cod liver oil -- 1 tablespoon (Tbsp) has 82 mg.  Butter -- 1 Tbsp has 15 mg.  Lard --  1 Tbsp has 14 mg.  Bacon grease -- 1 Tbsp has 14 mg.  Mayonnaise -- 1 Tbsp has 5-10 mg.  Margarine -- 1 Tbsp has 3-10 mg. Exact amounts of cholesterol in these foods may vary depending on specific ingredients and brands. Foods without cholesterol Most plant-based foods do not have cholesterol unless you combine them with a food that has cholesterol. Foods without cholesterol include:  Grains and cereals.  Vegetables.  Fruits.  Vegetable oils, such as olive, canola, and sunflower oil.  Legumes, such as peas, beans, and lentils.  Nuts and seeds.  Egg whites. Summary  The body needs cholesterol in small amounts, but too much cholesterol can cause damage to the arteries and heart.  Most people should eat less than 200 milligrams (mg) of cholesterol a day. This information is not intended to replace advice given to you by your health care provider. Make sure you discuss any questions you have with your health care provider. Document Revised: 06/17/2017 Document Reviewed: 03/01/2017 Elsevier Patient Education  [place].

## 2020-01-10 DIAGNOSIS — H524 Presbyopia: Secondary | ICD-10-CM | POA: Diagnosis not present

## 2020-01-10 LAB — HM DIABETES EYE EXAM

## 2020-01-18 ENCOUNTER — Encounter: Payer: Self-pay | Admitting: Family Medicine

## 2020-02-13 ENCOUNTER — Other Ambulatory Visit: Payer: Self-pay

## 2020-02-13 ENCOUNTER — Encounter: Payer: Self-pay | Admitting: Family Medicine

## 2020-02-13 ENCOUNTER — Ambulatory Visit (INDEPENDENT_AMBULATORY_CARE_PROVIDER_SITE_OTHER): Payer: Medicare HMO | Admitting: Family Medicine

## 2020-02-13 VITALS — BP 120/64 | HR 68 | Temp 98.6°F | Wt 219.5 lb

## 2020-02-13 DIAGNOSIS — E785 Hyperlipidemia, unspecified: Secondary | ICD-10-CM | POA: Diagnosis not present

## 2020-02-13 DIAGNOSIS — E1140 Type 2 diabetes mellitus with diabetic neuropathy, unspecified: Secondary | ICD-10-CM

## 2020-02-13 DIAGNOSIS — I10 Essential (primary) hypertension: Secondary | ICD-10-CM

## 2020-02-13 LAB — POCT GLYCOSYLATED HEMOGLOBIN (HGB A1C): Hemoglobin A1C: 6.3 % — AB (ref 4.0–5.6)

## 2020-02-13 NOTE — Patient Instructions (Signed)
Set up physical for some time after 05/08/20.   Keep up the good work!   A1C further improved to 6.3%

## 2020-02-13 NOTE — Progress Notes (Signed)
Established Patient Office Visit  Subjective:  Patient ID: Jared Byrd, male    DOB: 10-Dec-1950  Age: 69 y.o. MRN: [id number]  CC:  Chief Complaint  Patient presents with  . Follow-up    pt is here for 6 month follow up on diabetes     HPI Jared Byrd presents for diabetic follow-up.  He is currently on regimen of Metformin, Jardiance, and Trulicity.  He is not monitoring home blood sugars.  He has been consistently losing some weight this year since starting Trulicity.  His weight was 228 pounds back in October and down to 219 pounds currently.  He is trying to eat well.  No polyuria or polydipsia.  No side effects from medications.  He has hypertension treated with lisinopril and HCTZ as well as metoprolol.  No dizziness.  Blood pressures been stable.  No recent chest pains.  He has hyperlipidemia treated with atorvastatin.  His lipids were checked last October.  He plans to set up physical for sometime after October of this year.  He had recent eye exam about a month ago and no retinopathy changes were noted.  Past Medical History:  Diagnosis Date  . COLONIC POLYPS, HX OF 02/03/2009  . DIAB W/O MENTION COMP TYPE II/UNS TYPE UNCNTRL 02/03/2009  . GERD 02/03/2009  . HYPERTENSION 01/31/2009  . METABOLIC SYNDROME X 5/70/1779    Past Surgical History:  Procedure Laterality Date  . TONSILLECTOMY Bilateral     Family History  Problem Relation Age of Onset  . Cancer Mother        breast  . Diabetes Mother   . Hyperlipidemia Other   . Hypertension Other   . Heart disease Other   . Cancer Other        parent  . Cancer Sister 64       colon cancer  . Hypertension Father   . Heart disease Father   . Multiple endocrine neoplasia Father     Social History   Socioeconomic History  . Marital status: Married    Spouse name: Not on file  . Number of children: Not on file  . Years of education: Not on file  . Highest education level: Not on file  Occupational History  . Not  on file  Tobacco Use  . Smoking status: Former Smoker    Packs/day: 1.00    Years: 5.00    Pack years: 5.00    Types: Cigarettes    Quit date: 01/13/1976    Years since quitting: 44.1  . Smokeless tobacco: Never Used  Vaping Use  . Vaping Use: Never used  Substance and Sexual Activity  . Alcohol use: Not on file  . Drug use: Not on file  . Sexual activity: Not on file  Other Topics Concern  . Not on file  Social History Narrative  . Not on file   Social Determinants of Health   Financial Resource Strain:   . Difficulty of Paying Living Expenses:   Food Insecurity:   . Worried About Charity fundraiser in the Last Year:   . Arboriculturist in the Last Year:   Transportation Needs: No Transportation Needs  . Lack of Transportation (Medical): No  . Lack of Transportation (Non-Medical): No  Physical Activity: Inactive  . Days of Exercise per Week: 0 days  . Minutes of Exercise per Session: 0 min  Stress:   . Feeling of Stress :   Social Connections:   . Frequency  of Communication with Friends and Family:   . Frequency of Social Gatherings with Friends and Family:   . Attends Religious Services:   . Active Member of Clubs or Organizations:   . Attends Archivist Meetings:   Marland Kitchen Marital Status:   Intimate Partner Violence:   . Fear of Current or Ex-Partner:   . Emotionally Abused:   Marland Kitchen Physically Abused:   . Sexually Abused:     Outpatient Medications Prior to Visit  Medication Sig Dispense Refill  . Ascorbic Acid (VITAMIN C) 1000 MG tablet Take 1,000 mg by mouth daily.    Marland Kitchen aspirin 81 MG tablet Take 81 mg by mouth daily.      Marland Kitchen atorvastatin (LIPITOR) 10 MG tablet TAKE 1 TABLET EVERY DAY 90 tablet 2  . Blood Glucose Monitoring Suppl (ONETOUCH VERIO IQ SYSTEM) W/DEVICE KIT Use as instructed. 1 kit 0  . CO-ENZYME Q-10 PO Take 200 mg by mouth daily.     . Dulaglutide (TRULICITY) 1.32 GM/0.1UU SOPN Inject 0.75 mg into the skin once a week. 12 pen 3  .  hydrochlorothiazide (HYDRODIURIL) 25 MG tablet Take 0.5 tablets (12.5 mg total) by mouth daily. 90 tablet 0  . indomethacin (INDOCIN) 50 MG capsule TAKE 1 CAPSULE (50 MG TOTAL) BY MOUTH 3 (THREE) TIMES DAILY AS NEEDED. 30 capsule 2  . JARDIANCE 10 MG TABS tablet TAKE 1 TABLET EVERY DAY 90 tablet 1  . lisinopril (ZESTRIL) 40 MG tablet TAKE 1 TABLET EVERY DAY 90 tablet 1  . metFORMIN (GLUCOPHAGE) 500 MG tablet TAKE 2 TABLETS TWICE A DAY WITH MEALS 360 tablet 1  . metoprolol succinate (TOPROL-XL) 50 MG 24 hr tablet TAKE 1 TABLET DAILY WITH OR IMMEDIATELY FOLLOWING A MEAL 90 tablet 2  . Multiple Vitamin (MULTIVITAMIN) tablet Take 1 tablet by mouth daily. One A Day Mens     Facility-Administered Medications Prior to Visit  Medication Dose Route Frequency Provider Last Rate Last Admin  . zoster vaccine live (PF) (ZOSTAVAX) injection 19,400 Units  0.65 mL Subcutaneous Once Mahayla Haddaway, Alinda Sierras, MD        No Known Allergies  ROS Review of Systems  Constitutional: Negative for fatigue and unexpected weight change.  Eyes: Negative for visual disturbance.  Respiratory: Negative for cough, chest tightness and shortness of breath.   Cardiovascular: Negative for chest pain, palpitations and leg swelling.  Endocrine: Negative for polydipsia and polyuria.  Neurological: Negative for dizziness, syncope, weakness, light-headedness and headaches.      Objective:    Physical Exam Constitutional:      Appearance: He is well-developed.  HENT:     Right Ear: External ear normal.     Left Ear: External ear normal.  Eyes:     Pupils: Pupils are equal, round, and reactive to light.  Neck:     Thyroid: No thyromegaly.  Cardiovascular:     Rate and Rhythm: Normal rate and regular rhythm.  Pulmonary:     Effort: Pulmonary effort is normal. No respiratory distress.     Breath sounds: Normal breath sounds. No wheezing or rales.  Musculoskeletal:     Cervical back: Neck supple.     Right lower leg: No  edema.     Left lower leg: No edema.  Neurological:     Mental Status: He is alert and oriented to person, place, and time.     BP (!) 120/64 (BP Location: Right Arm, Patient Position: Sitting, Cuff Size: Normal)   Pulse 68  Temp 98.6 F (37 C) (Oral)   Wt (!) 219 lb 8 oz (99.6 kg)   SpO2 97%   BMI 28.57 kg/m  Wt Readings from Last 3 Encounters:  02/13/20 (!) 219 lb 8 oz (99.6 kg)  08/15/19 219 lb 11.2 oz (99.7 kg)  05/09/19 228 lb 3.2 oz (103.5 kg)     There are no preventive care reminders to display for this patient.  There are no preventive care reminders to display for this patient.  Lab Results  Component Value Date   TSH 2.69 03/03/2018   Lab Results  Component Value Date   WBC 6.2 03/03/2018   HGB 15.6 03/03/2018   HCT 45.4 03/03/2018   MCV 89.6 03/03/2018   PLT 260.0 03/03/2018   Lab Results  Component Value Date   NA 137 05/09/2019   K 4.3 05/09/2019   CO2 31 05/09/2019   GLUCOSE 168 (H) 05/09/2019   BUN 20 05/09/2019   CREATININE 1.21 05/09/2019   BILITOT 1.1 05/09/2019   ALKPHOS 63 05/09/2019   AST 17 05/09/2019   ALT 21 05/09/2019   PROT 7.1 05/09/2019   ALBUMIN 4.7 05/09/2019   CALCIUM 10.0 05/09/2019   GFR 59.52 (L) 05/09/2019   Lab Results  Component Value Date   CHOL 151 05/09/2019   Lab Results  Component Value Date   HDL 41.80 05/09/2019   Lab Results  Component Value Date   LDLCALC 47 02/23/2016   Lab Results  Component Value Date   TRIG 237.0 (H) 05/09/2019   Lab Results  Component Value Date   CHOLHDL 4 05/09/2019   Lab Results  Component Value Date   HGBA1C 6.3 (A) 02/13/2020      Assessment & Plan:   #1 type 2 diabetes improving with A1c 6.3%  -Continue current regimen.  I think we can wait and check his A1c about every 6 months at this point. -Continue with yearly eye exam  #2 hypertension stable and at goal -Continue current medications  #3 dyslipidemia treated with Lipitor. -Recheck lipids and  hepatic panel at follow-up for physical in the fall  No orders of the defined types were placed in this encounter.   Follow-up: No follow-ups on file.    Carolann Littler, MD

## 2020-03-27 ENCOUNTER — Telehealth: Payer: Medicare HMO

## 2020-03-27 NOTE — Chronic Care Management (AMB) (Deleted)
Chronic Care Management Pharmacy  Name: Jared Byrd  MRN: [id number] DOB: [birth year]   Chief Complaint/ HPI  Jared Byrd,  69 y.o. , male presents for their Initial CCM visit with the clinical pharmacist In office. 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 He is happily married since 2006. Him and his wife are active at [facility] in [place], [place]. His wife is retired since her job was affected by COVID and she is now helping out her younger brother with kidney and liver transplant. He is doing well as of now and no immediate concerns with his health.  PCP : Eulas Post, MD  Their chronic conditions include: HTN, HLD, type 2 diabetes  Office Visits: 02/13/20: Patient presented to Dr. Ward Givens for follow-up. A1c improved to 6.3%. Patient down 9 pounds since starting Trulicity.  09/17/19 OV - Medicare annual wellness visit. Patient is doing well. A1c dropped from 8.1% to to 0.7% when Trulicity was added to the regimen. No changes with other meds.  Consult Visit: None  Medications: Outpatient Encounter Medications as of 03/27/2020  Medication Sig  . Ascorbic Acid (VITAMIN C) 1000 MG tablet Take 1,000 mg by mouth daily.  Marland Kitchen aspirin 81 MG tablet Take 81 mg by mouth daily.    Marland Kitchen atorvastatin (LIPITOR) 10 MG tablet TAKE 1 TABLET EVERY DAY  . Blood Glucose Monitoring Suppl (ONETOUCH VERIO IQ SYSTEM) W/DEVICE KIT Use as instructed.  Marland Kitchen CO-ENZYME Q-10 PO Take 200 mg by mouth daily.   . Dulaglutide (TRULICITY) 1.21 FX/5.8IT SOPN Inject 0.75 mg into the skin once a week.  . hydrochlorothiazide (HYDRODIURIL) 25 MG tablet Take 0.5 tablets (12.5 mg total) by mouth daily.  . indomethacin (INDOCIN) 50 MG capsule TAKE 1 CAPSULE (50 MG TOTAL) BY MOUTH 3 (THREE) TIMES DAILY AS NEEDED.  Marland Kitchen JARDIANCE 10 MG TABS tablet TAKE 1 TABLET EVERY DAY  . lisinopril (ZESTRIL) 40 MG tablet TAKE 1 TABLET EVERY DAY  . metFORMIN  (GLUCOPHAGE) 500 MG tablet TAKE 2 TABLETS TWICE A DAY WITH MEALS  . metoprolol succinate (TOPROL-XL) 50 MG 24 hr tablet TAKE 1 TABLET DAILY WITH OR IMMEDIATELY FOLLOWING A MEAL  . Multiple Vitamin (MULTIVITAMIN) tablet Take 1 tablet by mouth daily. One A Day Mens   Facility-Administered Encounter Medications as of 03/27/2020  Medication  . zoster vaccine live (PF) (ZOSTAVAX) injection 19,400 Units      Transportation Needs: No Transportation Needs  . Lack of Transportation (Medical): No  . Lack of Transportation (Non-Medical): No      Physical Activity: Inactive  . Days of Exercise per Week: 0 days  . Minutes of Exercise per Session: 0 min    Current Diagnosis/Assessment:  Goals Addressed   None     Hypertension   Office blood pressures are  BP Readings from Last 3 Encounters:  02/13/20 (!) 120/64  08/15/19 110/76  05/09/19 104/62   CMP Latest Ref Rng & Units 05/09/2019 06/07/2018 03/03/2018  Glucose 70 - 99 mg/dL 168(H) 136(H) 148(H)  BUN 6 - 23 mg/dL 20 20 25(H)  Creatinine 0.40 - 1.50 mg/dL 1.21 1.13 1.30  Sodium 135 - 145 mEq/L 137 136 138  Potassium 3.5 - 5.1 mEq/L 4.3 4.4 4.4  Chloride 96 - 112 mEq/L 97 98 99  CO2 19 - 32 mEq/L _0 Calcium 8.4 - 10.5 mg/dL 10.0 10.1 10.8(H)  Total Protein 6.0 - 8.3 g/dL  7.1 - 7.2  Total Bilirubin 0.2 - 1.2 mg/dL 1.1 - 1.3(H)  Alkaline Phos 39 - 117 U/L 63 - 56  AST 0 - 37 U/L 17 - 21  ALT 0 - 53 U/L 21 - 21     Patient has failed these meds in the past: None Patient is currently controlled on the following medications:  . HCTZ 25 mg 0.5 tablet once daily . Lisinopril 40 mg 1 tablet once daily . Metoprolol succinate 50 mg 1 tablet once daily with or immediately following a meal  Patient checks BP at home infrequently  Discussed with patient the importance of routine blood pressure monitoring. Patient's BP has been immaculate and under the goal of < 140/90. Advised to at least check his blood pressure once a week to  keep him in check. Patient reports eating heart healthy diet including vegetables, lean meats, and low salt diet. Patient has no routine exercise established in place, but he keeps himself moving with his work.  Plan  Continue current medications and lifestyle modifications  Dyslipidemia   Lipid Panel     Component Value Date/Time   CHOL 151 05/09/2019 0835   TRIG 237.0 (H) 05/09/2019 0835   HDL 41.80 05/09/2019 0835   CHOLHDL 4 05/09/2019 0835   VLDL 47.4 (H) 05/09/2019 0835   LDLCALC 47 02/23/2016 0934   LDLDIRECT 78.0 05/09/2019 0835     The 10-year ASCVD risk score (Goff DC Jr., et al., 2013) is: 29.4%   Values used to calculate the score:     Age: 56 years     Sex: Male     Is Non-Hispanic African American: No     Diabetic: Yes     Tobacco smoker: No     Systolic Blood Pressure: 858 mmHg     Is BP treated: Yes     HDL Cholesterol: 41.8 mg/dL     Total Cholesterol: 151 mg/dL   Patient has failed these meds in past: None Patient is currently controlled on the following medications:  . Atorvastatin 10 mg 1 tablet once daily  Patient reports heart healthy diet mentioned above. Advised patient to take statin at bedtime instead of morning. Will have annual visit next month and we'll have a current picture of his lipid panel.  Plan  Continue current medications and lifestyle modifications Recheck lipid panel on his next follow-up visit  Diabetes   Recent Relevant Labs: Lab Results  Component Value Date/Time   HGBA1C 6.3 (A) 02/13/2020 06:59 AM   HGBA1C 6.9 (A) 08/15/2019 07:22 AM   HGBA1C 8.1 (H) 05/09/2019 08:35 AM   HGBA1C 7.6 (H) 06/07/2018 07:26 AM   GFR 59.52 (L) 05/09/2019 08:35 AM   GFR 68.65 06/07/2018 07:26 AM   MICROALBUR <0.7 01/24/2015 08:24 AM   MICROALBUR 0.2 01/23/2014 09:16 AM     Checking BG: Rarely  Patient has failed these meds in past: None Patient is currently controlled on the following medications:  . Trulicity 8.50 YD/7.4 ml inject  0.75 mg into the skin once a week . Jardiance 10 mg 1 tablet once daily  . Metformin 500 mg 2 tablets twice a day with meals  Last diabetic Eye exam:  Lab Results  Component Value Date/Time   HMDIABEYEEXA No Retinopathy 01/10/2020 12:00 AM    Last diabetic Foot exam:  Lab Results  Component Value Date/Time   HMDIABFOOTEX  02/23/2016 12:00 AM    Mild impairment monofilament testing ventral surface of several toes otherwise normal exam  Plan  Continue current medications and lifestyle modification Recheck A1c on his next follow-up visit  Gout   Patient has failed these meds in past: None Patient is currently controlled on the following medications:  . Indomethacin 50 mg 1 capsule 3 times daily PRN  Patient reports no flares for 1-2 years. Patient reports that he has not taken his indomethacin for a long time and probably expired now. D/C indomethacin on his active med list.  Plan  Recommend taking off gout on his active problem list Recommend discontinuing indomethacin on his active med list   OTCs/Health Maintenance   Patient is currently controlled on the following medications: . Aspirin 81 mg 1 tablet daily . Onetouch Verio IQ system . Vitamin C 1000 mg 1 tablet daily . Fish oil 1000 mg 1 capsule daily . CoQ10 200 mg 1 capsule daily . One A Day Mens Multivitamin 1 tablet daily  Plan  Continue current medications  Vaccines   Reviewed and discussed patient's vaccination history.    Immunization History  Administered Date(s) Administered  . Fluad Quad(high Dose 65+) 05/09/2019  . Influenza Split 05/04/2011, 04/13/2014  . Influenza, High Dose Seasonal PF 05/12/2017  . Influenza-Unspecified 05/01/2015, 04/24/2016  . Moderna SARS-COVID-2 Vaccination 11/28/2019, 12/26/2019  . Pneumococcal Conjugate-13 02/23/2016  . Pneumococcal Polysaccharide-23 01/17/2012, 03/03/2018  . Td 02/04/2006  . Tdap 01/17/2012  . Zoster 01/17/2012  . Zoster Recombinat (Shingrix)  03/26/2017    Medication Management   Pharmacy/Benefits: [facility] [facility] Pt endorses 100% compliance  Discussed the benefits of [facility], but patient has lower copays with [employer]. Advised patient that this is an option for him in the future.  Plan  Continue current medication management strategy   Follow up: 3 month phone visit  [facility] [place]

## 2020-04-17 ENCOUNTER — Telehealth: Payer: Self-pay

## 2020-04-17 NOTE — Progress Notes (Signed)
    Chronic Care Management Pharmacy Assistant   Name: Jared Byrd  MRN: [id number] DOB: 03/29/1951  Reason for Encounter: Medication Review   PCP : Eulas Post, MD  Allergies:  No Known Allergies  Medications: Outpatient Encounter Medications as of 04/17/2020  Medication Sig  . Ascorbic Acid (VITAMIN C) 1000 MG tablet Take 1,000 mg by mouth daily.  Marland Kitchen aspirin 81 MG tablet Take 81 mg by mouth daily.    Marland Kitchen atorvastatin (LIPITOR) 10 MG tablet TAKE 1 TABLET EVERY DAY  . Blood Glucose Monitoring Suppl (ONETOUCH VERIO IQ SYSTEM) W/DEVICE KIT Use as instructed.  Marland Kitchen CO-ENZYME Q-10 PO Take 200 mg by mouth daily.   . Dulaglutide (TRULICITY) 1.58 EW/2.5RK SOPN Inject 0.75 mg into the skin once a week.  . hydrochlorothiazide (HYDRODIURIL) 25 MG tablet Take 0.5 tablets (12.5 mg total) by mouth daily.  . indomethacin (INDOCIN) 50 MG capsule TAKE 1 CAPSULE (50 MG TOTAL) BY MOUTH 3 (THREE) TIMES DAILY AS NEEDED.  Marland Kitchen JARDIANCE 10 MG TABS tablet TAKE 1 TABLET EVERY DAY  . lisinopril (ZESTRIL) 40 MG tablet TAKE 1 TABLET EVERY DAY  . metFORMIN (GLUCOPHAGE) 500 MG tablet TAKE 2 TABLETS TWICE A DAY WITH MEALS  . metoprolol succinate (TOPROL-XL) 50 MG 24 hr tablet TAKE 1 TABLET DAILY WITH OR IMMEDIATELY FOLLOWING A MEAL  . Multiple Vitamin (MULTIVITAMIN) tablet Take 1 tablet by mouth daily. One A Day Mens   Facility-Administered Encounter Medications as of 04/17/2020  Medication  . zoster vaccine live (PF) (ZOSTAVAX) injection 19,400 Units    Current Diagnosis: Patient Active Problem List   Diagnosis Date Noted  . Obesity (BMI 30-39.9) 01/30/2014  . Gout 01/17/2013  . Dyslipidemia 01/17/2013  . Type 2 diabetes mellitus with diabetic neuropathy, unspecified ([place]) 02/03/2009  . METABOLIC SYNDROME X 93/55/2174  . GERD 02/03/2009  . COLONIC POLYPS, HX OF 02/03/2009  . Essential hypertension 01/31/2009    Goals Addressed   None     Follow-Up:  Pharmacist Review   Reviewed chart and  adherence measures. Per insurance data patient is 71% to her Trulicity for [id number],89% adherent to her lisinopril for hypertension, and 90% adherent to her atorvastatin for hyperlipidemia.Patient is at adherent with Trulicity, lisinopril and atorvastatin.  [place] Pharmacist Assistant [phone]

## 2020-04-30 ENCOUNTER — Other Ambulatory Visit: Payer: Self-pay | Admitting: Family Medicine

## 2020-04-30 DIAGNOSIS — Z0000 Encounter for general adult medical examination without abnormal findings: Secondary | ICD-10-CM

## 2020-05-21 ENCOUNTER — Encounter: Payer: Self-pay | Admitting: Family Medicine

## 2020-05-21 ENCOUNTER — Other Ambulatory Visit: Payer: Self-pay

## 2020-05-21 ENCOUNTER — Ambulatory Visit (INDEPENDENT_AMBULATORY_CARE_PROVIDER_SITE_OTHER): Payer: Medicare HMO | Admitting: Family Medicine

## 2020-05-21 VITALS — BP 122/80 | HR 66 | Temp 98.1°F | Ht 73.0 in | Wt 222.0 lb

## 2020-05-21 DIAGNOSIS — Z23 Encounter for immunization: Secondary | ICD-10-CM | POA: Diagnosis not present

## 2020-05-21 DIAGNOSIS — Z0000 Encounter for general adult medical examination without abnormal findings: Secondary | ICD-10-CM

## 2020-05-21 LAB — LIPID PANEL
Cholesterol: 129 mg/dL (ref 0–200)
HDL: 43.7 mg/dL (ref >39.00)
LDL Cholesterol: 54 mg/dL (ref 0–99)
NonHDL: 85.38
Total CHOL/HDL Ratio: 3
Triglycerides: 158 mg/dL — ABNORMAL HIGH (ref 0.0–149.0)
VLDL: 31.6 mg/dL (ref 0.0–40.0)

## 2020-05-21 LAB — CBC WITH DIFFERENTIAL/PLATELET
Basophils Absolute: 0 10*3/uL (ref 0.0–0.1)
Basophils Relative: 0.3 % (ref 0.0–3.0)
Eosinophils Absolute: 0.2 10*3/uL (ref 0.0–0.7)
Eosinophils Relative: 3.7 % (ref 0.0–5.0)
HCT: 45.8 % (ref 39.0–52.0)
Hemoglobin: 15.4 g/dL (ref 13.0–17.0)
Lymphocytes Relative: 20.7 % (ref 12.0–46.0)
Lymphs Abs: 1.4 10*3/uL (ref 0.7–4.0)
MCHC: 33.6 g/dL (ref 30.0–36.0)
MCV: 91.8 fl (ref 78.0–100.0)
Monocytes Absolute: 0.4 10*3/uL (ref 0.1–1.0)
Monocytes Relative: 6.2 % (ref 3.0–12.0)
Neutro Abs: 4.6 10*3/uL (ref 1.4–7.7)
Neutrophils Relative %: 69.1 % (ref 43.0–77.0)
Platelets: 273 10*3/uL (ref 150.0–400.0)
RBC: 4.99 Mil/uL (ref 4.22–5.81)
RDW: 13.3 % (ref 11.5–15.5)
WBC: 6.6 10*3/uL (ref 4.0–10.5)

## 2020-05-21 LAB — BASIC METABOLIC PANEL
BUN: 19 mg/dL (ref 6–23)
CO2: 32 mEq/L (ref 19–32)
Calcium: 9.8 mg/dL (ref 8.4–10.5)
Chloride: 96 mEq/L (ref 96–112)
Creatinine, Ser: 1.12 mg/dL (ref 0.40–1.50)
GFR: 66.95 mL/min (ref >60.00)
Glucose, Bld: 129 mg/dL — ABNORMAL HIGH (ref 70–99)
Potassium: 4.4 mEq/L (ref 3.5–5.1)
Sodium: 135 mEq/L (ref 135–145)

## 2020-05-21 LAB — HEPATIC FUNCTION PANEL
ALT: 19 U/L (ref 0–53)
AST: 16 U/L (ref 0–37)
Albumin: 4.6 g/dL (ref 3.5–5.2)
Alkaline Phosphatase: 65 U/L (ref 39–117)
Bilirubin, Direct: 0.2 mg/dL (ref 0.0–0.3)
Total Bilirubin: 1 mg/dL (ref 0.2–1.2)
Total Protein: 7.1 g/dL (ref 6.0–8.3)

## 2020-05-21 LAB — TSH: TSH: 2.98 u[IU]/mL (ref 0.35–4.50)

## 2020-05-21 LAB — PSA: PSA: 1.89 ng/mL (ref 0.10–4.00)

## 2020-05-21 LAB — HEMOGLOBIN A1C: Hgb A1c MFr Bld: 7.4 % — ABNORMAL HIGH (ref 4.6–6.5)

## 2020-05-21 NOTE — Progress Notes (Signed)
Established Patient Office Visit  Subjective:  Patient ID: Jared Byrd, male    DOB: January 15, 1951  Age: 69 y.o. MRN: [id number]  CC:  Chief Complaint  Patient presents with  . Annual Exam    HPI Jared Byrd presents for physical exam.  He has history of obesity, type 2 diabetes, hypertension, dyslipidemia, gout, metabolic syndrome X.  Generally doing fairly well though he has gained a couple pounds since last visit.  Poor compliance with diet at times.  Inconsistent exercise.  His A1c's been well controlled last was 6.3%.  Not checking blood sugars regularly.  Compliant with medications.  Health maintenance reviewed  -Needs flu vaccine -Covid vaccines complete -Pneumonia vaccines complete -Shingles vaccine complete -Previous hepatitis C screen negative -Colonoscopy due 9/30 -Tetanus due 2023 -Eye exam up-to-date  Social history-he has a brother-in-law who died of liver disease complications recently.  He is married.  Non-smoker.  No alcohol.  Family history-no significant changes  Past Medical History:  Diagnosis Date  . COLONIC POLYPS, HX OF 02/03/2009  . DIAB W/O MENTION COMP TYPE II/UNS TYPE UNCNTRL 02/03/2009  . GERD 02/03/2009  . HYPERTENSION 01/31/2009  . METABOLIC SYNDROME X 01/12/349    Past Surgical History:  Procedure Laterality Date  . TONSILLECTOMY Bilateral     Family History  Problem Relation Age of Onset  . Cancer Mother        breast  . Diabetes Mother   . Hyperlipidemia Other   . Hypertension Other   . Heart disease Other   . Cancer Other        parent  . Cancer Sister 33       colon cancer  . Hypertension Father   . Heart disease Father   . Multiple endocrine neoplasia Father     Social History   Socioeconomic History  . Marital status: Married    Spouse name: Not on file  . Number of children: Not on file  . Years of education: Not on file  . Highest education level: Not on file  Occupational History  . Not on file  Tobacco Use    . Smoking status: Former Smoker    Packs/day: 1.00    Years: 5.00    Pack years: 5.00    Types: Cigarettes    Quit date: 01/13/1976    Years since quitting: 44.3  . Smokeless tobacco: Never Used  Vaping Use  . Vaping Use: Never used  Substance and Sexual Activity  . Alcohol use: Not Currently  . Drug use: Not on file  . Sexual activity: Not on file  Other Topics Concern  . Not on file  Social History Narrative  . Not on file   Social Determinants of Health   Financial Resource Strain:   . Difficulty of Paying Living Expenses: Not on file  Food Insecurity:   . Worried About Charity fundraiser in the Last Year: Not on file  . Ran Out of Food in the Last Year: Not on file  Transportation Needs: No Transportation Needs  . Lack of Transportation (Medical): No  . Lack of Transportation (Non-Medical): No  Physical Activity: Inactive  . Days of Exercise per Week: 0 days  . Minutes of Exercise per Session: 0 min  Stress:   . Feeling of Stress : Not on file  Social Connections:   . Frequency of Communication with Friends and Family: Not on file  . Frequency of Social Gatherings with Friends and Family: Not on file  .  Attends Religious Services: Not on file  . Active Member of Clubs or Organizations: Not on file  . Attends Archivist Meetings: Not on file  . Marital Status: Not on file  Intimate Partner Violence:   . Fear of Current or Ex-Partner: Not on file  . Emotionally Abused: Not on file  . Physically Abused: Not on file  . Sexually Abused: Not on file    Outpatient Medications Prior to Visit  Medication Sig Dispense Refill  . Ascorbic Acid (VITAMIN C) 1000 MG tablet Take 1,000 mg by mouth daily.    Marland Kitchen aspirin 81 MG tablet Take 81 mg by mouth daily.      Marland Kitchen atorvastatin (LIPITOR) 10 MG tablet TAKE 1 TABLET EVERY DAY 90 tablet 2  . Blood Glucose Monitoring Suppl (ONETOUCH VERIO IQ SYSTEM) W/DEVICE KIT Use as instructed. 1 kit 0  . CO-ENZYME Q-10 PO Take 200  mg by mouth daily.     . Dulaglutide (TRULICITY) 4.09 WJ/1.9JY SOPN Inject 0.75 mg into the skin once a week. 12 pen 3  . hydrochlorothiazide (HYDRODIURIL) 25 MG tablet Take 0.5 tablets (12.5 mg total) by mouth daily. 90 tablet 0  . indomethacin (INDOCIN) 50 MG capsule TAKE 1 CAPSULE (50 MG TOTAL) BY MOUTH 3 (THREE) TIMES DAILY AS NEEDED. 30 capsule 2  . JARDIANCE 10 MG TABS tablet TAKE 1 TABLET EVERY DAY 90 tablet 1  . lisinopril (ZESTRIL) 40 MG tablet TAKE 1 TABLET EVERY DAY 90 tablet 1  . metFORMIN (GLUCOPHAGE) 500 MG tablet TAKE 2 TABLETS TWICE A DAY WITH MEALS 360 tablet 1  . metoprolol succinate (TOPROL-XL) 50 MG 24 hr tablet TAKE 1 TABLET DAILY WITH OR IMMEDIATELY FOLLOWING A MEAL 90 tablet 2  . Multiple Vitamin (MULTIVITAMIN) tablet Take 1 tablet by mouth daily. One A Day Mens     Facility-Administered Medications Prior to Visit  Medication Dose Route Frequency Provider Last Rate Last Admin  . zoster vaccine live (PF) (ZOSTAVAX) injection 19,400 Units  0.65 mL Subcutaneous Once Adriene Padula, Alinda Sierras, MD        No Known Allergies  ROS Review of Systems  Constitutional: Negative for activity change, appetite change, fatigue, fever and unexpected weight change.  HENT: Negative for congestion, ear pain and trouble swallowing.   Eyes: Negative for pain and visual disturbance.  Respiratory: Negative for cough, shortness of breath and wheezing.   Cardiovascular: Negative for chest pain and palpitations.  Gastrointestinal: Negative for abdominal distention, abdominal pain, blood in stool, constipation, diarrhea, nausea, rectal pain and vomiting.  Endocrine: Negative for polydipsia and polyuria.  Genitourinary: Negative for dysuria, hematuria and testicular pain.  Musculoskeletal: Negative for arthralgias and joint swelling.  Skin: Negative for rash.  Neurological: Negative for dizziness, syncope and headaches.  Hematological: Negative for adenopathy.  Psychiatric/Behavioral: Negative for  confusion and dysphoric mood.      Objective:    Physical Exam Constitutional:      General: He is not in acute distress.    Appearance: He is well-developed.  HENT:     Head: Normocephalic and atraumatic.     Right Ear: External ear normal.     Left Ear: External ear normal.  Eyes:     Conjunctiva/sclera: Conjunctivae normal.     Pupils: Pupils are equal, round, and reactive to light.  Neck:     Thyroid: No thyromegaly.  Cardiovascular:     Rate and Rhythm: Normal rate and regular rhythm.     Heart sounds: Normal heart sounds.  No murmur heard.   Pulmonary:     Effort: No respiratory distress.     Breath sounds: No wheezing or rales.  Abdominal:     General: Bowel sounds are normal. There is no distension.     Palpations: Abdomen is soft. There is no mass.     Tenderness: There is no abdominal tenderness. There is no guarding or rebound.  Musculoskeletal:     Cervical back: Normal range of motion and neck supple.  Lymphadenopathy:     Cervical: No cervical adenopathy.  Skin:    Findings: No rash.     Comments: Feet reveal no skin lesions. Good distal foot pulses. Good capillary refill. No calluses.  Only impairment with monofilament is great toe bilaterally volar surface   Neurological:     Mental Status: He is alert and oriented to person, place, and time.     Cranial Nerves: No cranial nerve deficit.     Deep Tendon Reflexes: Reflexes normal.     BP 122/80 (BP Location: Left Arm, Patient Position: Sitting, Cuff Size: Normal)   Pulse 66   Temp 98.1 F (36.7 C) (Oral)   Ht '6\' 1"'  (1.854 m)   Wt 222 lb (100.7 kg)   SpO2 96%   BMI 29.29 kg/m  Wt Readings from Last 3 Encounters:  05/21/20 222 lb (100.7 kg)  02/13/20 (!) 219 lb 8 oz (99.6 kg)  08/15/19 219 lb 11.2 oz (99.7 kg)     There are no preventive care reminders to display for this patient.  There are no preventive care reminders to display for this patient.  Lab Results  Component Value Date   TSH  2.69 03/03/2018   Lab Results  Component Value Date   WBC 6.2 03/03/2018   HGB 15.6 03/03/2018   HCT 45.4 03/03/2018   MCV 89.6 03/03/2018   PLT 260.0 03/03/2018   Lab Results  Component Value Date   NA 137 05/09/2019   K 4.3 05/09/2019   CO2 31 05/09/2019   GLUCOSE 168 (H) 05/09/2019   BUN 20 05/09/2019   CREATININE 1.21 05/09/2019   BILITOT 1.1 05/09/2019   ALKPHOS 63 05/09/2019   AST 17 05/09/2019   ALT 21 05/09/2019   PROT 7.1 05/09/2019   ALBUMIN 4.7 05/09/2019   CALCIUM 10.0 05/09/2019   GFR 59.52 (L) 05/09/2019   Lab Results  Component Value Date   CHOL 151 05/09/2019   Lab Results  Component Value Date   HDL 41.80 05/09/2019   Lab Results  Component Value Date   LDLCALC 47 02/23/2016   Lab Results  Component Value Date   TRIG 237.0 (H) 05/09/2019   Lab Results  Component Value Date   CHOLHDL 4 05/09/2019   Lab Results  Component Value Date   HGBA1C 6.3 (A) 02/13/2020      Assessment & Plan:   Problem List Items Addressed This Visit    None    Visit Diagnoses    Physical exam    -  Primary   Relevant Orders   Basic metabolic panel   Lipid panel   CBC with Differential/Platelet   TSH   Hepatic function panel   Hemoglobin A1c   PSA   Need for influenza vaccination       Relevant Orders   Flu Vaccine QUAD High Dose(Fluad) (Completed)    He has chronic problems as above including obesity, type 2 diabetes, hypertension, dyslipidemia  -Strongly encouraged to lose some weight -Flu vaccine given -Obtain  follow-up labs including PSA after discussion of risks and benefits.  We will also check A1c  No orders of the defined types were placed in this encounter.   Follow-up: Return in about 6 months (around 11/18/2020).    Carolann Littler, MD

## 2020-05-21 NOTE — Patient Instructions (Signed)

## 2020-09-16 ENCOUNTER — Telehealth: Payer: Self-pay | Admitting: Family Medicine

## 2020-09-16 NOTE — Telephone Encounter (Signed)
Left message for patient to call back and schedule Medicare Annual Wellness Visit (AWV) either virtually or in office. No detailed message left   Last AWV 09/17/19 please schedule at anytime with [facility] Nurse Health Advisor 1 or 2   This should be a 45 minute visit.

## 2020-10-03 ENCOUNTER — Ambulatory Visit: Payer: Medicare HMO

## 2020-10-03 NOTE — Progress Notes (Deleted)
Subjective:   Jared Byrd is a 70 y.o. male who presents for Medicare Annual/Subsequent preventive examination.  I connected with *** today by telephone and verified that I am speaking with the correct person using two identifiers. Location patient: home Location provider: work Persons participating in the virtual visit: patient, provider.   I discussed the limitations, risks, security and privacy concerns of performing an evaluation and management service by telephone and the availability of in person appointments. I also discussed with the patient that there may be a patient responsible charge related to this service. The patient expressed understanding and verbally consented to this telephonic visit.    Interactive audio and video telecommunications were attempted between this provider and patient, however failed, due to patient having technical difficulties OR patient did not have access to video capability.  We continued and completed visit with audio only.      Review of Systems    N/A        Objective:    There were no vitals filed for this visit. There is no height or weight on file to calculate BMI.  No flowsheet data found.  Current Medications (verified) Outpatient Encounter Medications as of 10/03/2020  Medication Sig  . Ascorbic Acid (VITAMIN C) 1000 MG tablet Take 1,000 mg by mouth daily.  Marland Kitchen aspirin 81 MG tablet Take 81 mg by mouth daily.    Marland Kitchen atorvastatin (LIPITOR) 10 MG tablet TAKE 1 TABLET EVERY DAY  . Blood Glucose Monitoring Suppl (ONETOUCH VERIO IQ SYSTEM) W/DEVICE KIT Use as instructed.  Marland Kitchen CO-ENZYME Q-10 PO Take 200 mg by mouth daily.   . Dulaglutide (TRULICITY) 7.34 KA/7.6OT SOPN Inject 0.75 mg into the skin once a week.  . hydrochlorothiazide (HYDRODIURIL) 25 MG tablet Take 0.5 tablets (12.5 mg total) by mouth daily.  . indomethacin (INDOCIN) 50 MG capsule TAKE 1 CAPSULE (50 MG TOTAL) BY MOUTH 3 (THREE) TIMES DAILY AS NEEDED.  Marland Kitchen JARDIANCE 10 MG TABS  tablet TAKE 1 TABLET EVERY DAY  . lisinopril (ZESTRIL) 40 MG tablet TAKE 1 TABLET EVERY DAY  . metFORMIN (GLUCOPHAGE) 500 MG tablet TAKE 2 TABLETS TWICE A DAY WITH MEALS  . metoprolol succinate (TOPROL-XL) 50 MG 24 hr tablet TAKE 1 TABLET DAILY WITH OR IMMEDIATELY FOLLOWING A MEAL  . Multiple Vitamin (MULTIVITAMIN) tablet Take 1 tablet by mouth daily. One A Day Mens   Facility-Administered Encounter Medications as of 10/03/2020  Medication  . zoster vaccine live (PF) (ZOSTAVAX) injection 19,400 Units    Allergies (verified) Patient has no known allergies.   History: Past Medical History:  Diagnosis Date  . COLONIC POLYPS, HX OF 02/03/2009  . DIAB W/O MENTION COMP TYPE II/UNS TYPE UNCNTRL 02/03/2009  . GERD 02/03/2009  . HYPERTENSION 01/31/2009  . METABOLIC SYNDROME X 1/57/2620   Past Surgical History:  Procedure Laterality Date  . TONSILLECTOMY Bilateral    Family History  Problem Relation Age of Onset  . Cancer Mother        breast  . Diabetes Mother   . Hyperlipidemia Other   . Hypertension Other   . Heart disease Other   . Cancer Other        parent  . Cancer Sister 82       colon cancer  . Hypertension Father   . Heart disease Father   . Multiple endocrine neoplasia Father    Social History   Socioeconomic History  . Marital status: Married    Spouse name: Not on file  .  Number of children: Not on file  . Years of education: Not on file  . Highest education level: Not on file  Occupational History  . Not on file  Tobacco Use  . Smoking status: Former Smoker    Packs/day: 1.00    Years: 5.00    Pack years: 5.00    Types: Cigarettes    Quit date: 01/13/1976    Years since quitting: 44.7  . Smokeless tobacco: Never Used  Vaping Use  . Vaping Use: Never used  Substance and Sexual Activity  . Alcohol use: Not Currently  . Drug use: Not on file  . Sexual activity: Not on file  Other Topics Concern  . Not on file  Social History Narrative  . Not on file    Social Determinants of Health   Financial Resource Strain: Not on file  Food Insecurity: Not on file  Transportation Needs: No Transportation Needs  . Lack of Transportation (Medical): No  . Lack of Transportation (Non-Medical): No  Physical Activity: Inactive  . Days of Exercise per Week: 0 days  . Minutes of Exercise per Session: 0 min  Stress: Not on file  Social Connections: Not on file    Tobacco Counseling Counseling given: Not Answered   Clinical Intake:                 Diabetic?Yes Nutrition Risk Assessment:  Has the patient had any N/V/D within the last 2 months?  {YES/NO:21197} Does the patient have any non-healing wounds?  {YES/NO:21197} Has the patient had any unintentional weight loss or weight gain?  {YES/NO:21197}  Diabetes:  Is the patient diabetic?  Yes  If diabetic, was a CBG obtained today?  No  Did the patient bring in their glucometer from home?  No  How often do you monitor your CBG's? ***.   Financial Strains and Diabetes Management:  Are you having any financial strains with the device, your supplies or your medication? {YES/NO:21197}.  Does the patient want to be seen by Chronic Care Management for management of their diabetes?  {YES/NO:21197} Would the patient like to be referred to a Nutritionist or for Diabetic Management?  {YES/NO:21197}  Diabetic Exams:  Diabetic Eye Exam: Completed 01/10/2020 Diabetic Foot Exam: Completed 05/21/2020          Activities of Daily Living No flowsheet data found.  Patient Care Team: Eulas Post, MD as PCP - General [place], Sharma Covert, [facility] (Inactive) as Pharmacist (Pharmacist)  Indicate any recent Medical Services you may have received from other than [facility] providers in the past year (date may be approximate).     Assessment:   This is a routine wellness examination for [place].  Hearing/Vision screen No exam data present  Dietary issues and exercise activities discussed:     Goals    . Chronic Care Management     CARE PLAN ENTRY  Current Barriers:  . Chronic Disease Management support, education, and care coordination needs related to Hypertension, Hyperlipidemia, and Diabetes   Hypertension . Pharmacist Clinical Goal(s): o Over the next 90 days, patient will work with PharmD and providers to maintain BP goal <140/90 . Current regimen:  . HCTZ 25 mg 0.5 tablet once daily . Lisinopril 40 mg 1 tablet once daily . Metoprolol succinate 50 mg 1 tablet once daily with or immediately following a meal . Interventions: o Discussed the importance of routine blood pressure monitoring o Discussed the benefits of heart healthy diet and low salt intake . Patient self  care activities - Over the next 90 days, patient will: o Check BP once a week, document, and provide at future appointments o Ensure daily salt intake < 2300 mg/day  Hyperlipidemia . Pharmacist Clinical Goal(s): o Over the next 90 days, patient will work with PharmD and providers to maintain LDL goal < 100 . Current regimen:  o Atorvastatin 10 mg 1 tablet once daily . Interventions: o Advised the patient to take statin at bedtime optimal results . Patient self care activities - Over the next 90 days, patient will: o Take atorvastatin 10 mg daily at bedtime  Diabetes . Pharmacist Clinical Goal(s): o Over the next 90 days, patient will work with PharmD and providers to maintain A1c goal <7% . Current regimen:  . Trulicity 3.21 YY/4.8 ml inject 0.75 mg into the skin once a week . Jardiance 10 mg 1 tablet once daily  . Metformin 500 mg 2 tablets twice a day with meal Interventions: o Discussed the benefits of lowering A1c further when it comes to cost savings o Advised to check fasting blood sugar every morning . Patient self care activities - Over the next 90 days, patient will: o Check blood sugar in the morning before eating or drinking, document, and provide at future appointments o Contact  provider with any episodes of hypoglycemia o Continue to eat portion size meals and cut down on sugar and carbohydrates  Medication management . Pharmacist Clinical Goal(s): o Over the next 90 days, patient will work with PharmD and providers to maintain optimal medication adherence . Current pharmacy: [facility] . Interventions o Comprehensive medication review performed. o Continue current medication management strategy . Patient self care activities - Over the next 90 days, patient will: o Take medications as prescribed o Report any questions or concerns to PharmD and/or provider(s)  Initial goal documentation       Depression Screen PHQ 2/9 Scores 05/21/2020 12/25/2019 09/17/2019 05/09/2019 03/03/2018 03/01/2017  PHQ - 2 Score 0 0 0 0 0 0  PHQ- 9 Score - 0 - - - -    Fall Risk Fall Risk  05/21/2020 09/17/2019 05/09/2019 03/03/2018 03/01/2017  Falls in the past year? - 0 0 No No  Injury with Fall? 0 - - - -  Follow up Falls evaluation completed - - - -    FALL RISK PREVENTION PERTAINING TO THE HOME:  Any stairs in or around the home? {YES/NO:21197} If so, are there any without handrails? No  Home free of loose throw rugs in walkways, pet beds, electrical cords, etc? Yes  Adequate lighting in your home to reduce risk of falls? Yes   ASSISTIVE DEVICES UTILIZED TO PREVENT FALLS:  Life alert? {YES/NO:21197} Use of a cane, walker or w/c? {YES/NO:21197} Grab bars in the bathroom? {YES/NO:21197} Shower chair or bench in shower? {YES/NO:21197} Elevated toilet seat or a handicapped toilet? {YES/NO:21197}    Cognitive Function:        Immunizations Immunization History  Administered Date(s) Administered  . Fluad Quad(high Dose 65+) 05/09/2019, 05/21/2020  . Influenza Split 05/04/2011, 04/13/2014  . Influenza, High Dose Seasonal PF 05/12/2017, 05/05/2018  . Influenza-Unspecified 05/01/2015, 04/24/2016  . Moderna Sars-Covid-2 Vaccination 11/28/2019, 12/26/2019  .  Pneumococcal Conjugate-13 02/23/2016  . Pneumococcal Polysaccharide-23 01/17/2012, 03/03/2018  . Td 02/04/2006  . Tdap 01/17/2012  . Zoster 01/17/2012  . Zoster Recombinat (Shingrix) 03/26/2017, 07/05/2017    TDAP status: Up to date  Flu Vaccine status: Up to date  Pneumococcal vaccine status: Up to date  Covid-19  vaccine status: Completed vaccines  Qualifies for Shingles Vaccine? Yes   Zostavax completed Yes   Shingrix Completed?: Yes  Screening Tests Health Maintenance  Topic Date Due  . COVID-19 Vaccine (3 - Booster for Moderna series) 06/26/2020  . HEMOGLOBIN A1C  11/18/2020  . OPHTHALMOLOGY EXAM  01/09/2021  . FOOT EXAM  05/21/2021  . TETANUS/TDAP  01/16/2022  . COLONOSCOPY (Pts 45-51yr Insurance coverage will need to be confirmed)  04/17/2029  . INFLUENZA VACCINE  Completed  . Hepatitis C Screening  Completed  . PNA vac Low Risk Adult  Completed  . HPV VACCINES  Aged Out    Health Maintenance  Health Maintenance Due  Topic Date Due  . COVID-19 Vaccine (3 - Booster for Moderna series) 06/26/2020    Colorectal cancer screening: Type of screening: Colonoscopy. Completed 04/18/2019. Repeat every 10 years  Lung Cancer Screening: (Low Dose CT Chest recommended if Age 70-80years, 30 pack-year currently smoking OR have quit w/in 15years.) does not qualify.   Lung Cancer Screening Referral: N/A   Additional Screening:  Hepatitis C Screening: does qualify; Completed 02/23/2016  Vision Screening: Recommended annual ophthalmology exams for early detection of glaucoma and other disorders of the eye. Is the patient up to date with their annual eye exam?  {YES/NO:21197} Who is the provider or what is the name of the office in which the patient attends annual eye exams? *** If pt is not established with a provider, would they like to be referred to a provider to establish care? {YES/NO:21197}.   Dental Screening: Recommended annual dental exams for proper oral  hygiene  Community Resource Referral / Chronic Care Management: CRR required this visit?  No   CCM required this visit?  No      Plan:     I have personally reviewed and noted the following in the patient's chart:   . Medical and social history . Use of alcohol, tobacco or illicit drugs  . Current medications and supplements . Functional ability and status . Nutritional status . Physical activity . Advanced directives . List of other physicians . Hospitalizations, surgeries, and ER visits in previous 12 months . Vitals . Screenings to include cognitive, depression, and falls . Referrals and appointments  In addition, I have reviewed and discussed with patient certain preventive protocols, quality metrics, and best practice recommendations. A written personalized care plan for preventive services as well as general preventive health recommendations were provided to patient.     SOfilia Neas LPN   [id number]  Nurse Notes: None

## 2020-10-03 NOTE — Progress Notes (Signed)
Erroneous Encounter

## 2020-10-27 NOTE — Progress Notes (Signed)
Subjective:   Jared Byrd is a 70 y.o. male who presents for Medicare Annual/Subsequent preventive examination.  I connected with Jared Byrd today by telephone and verified that I am speaking with the correct person using two identifiers. Location patient: home Location provider: work Persons participating in the virtual visit: patient, provider.   I discussed the limitations, risks, security and privacy concerns of performing an evaluation and management service by telephone and the availability of in person appointments. I also discussed with the patient that there may be a patient responsible charge related to this service. The patient expressed understanding and verbally consented to this telephonic visit.    Interactive audio and video telecommunications were attempted between this provider and patient, however failed, due to patient having technical difficulties OR patient did not have access to video capability.  We continued and completed visit with audio only.      Review of Systems    N/a  Cardiac Risk Factors include: advanced age (>74mn, >>58women);diabetes mellitus;hypertension;dyslipidemia;male gender     Objective:    Today's Vitals   There is no height or weight on file to calculate BMI.  Advanced Directives 10/28/2020  Does Patient Have a Medical Advance Directive? Yes  Type of Advance Directive Living will;Healthcare Power of Attorney  Does patient want to make changes to medical advance directive? No - Patient declined  [place]? No - copy requested    Current Medications (verified) Outpatient Encounter Medications as of 10/28/2020  Medication Sig  . Ascorbic Acid (VITAMIN C) 1000 MG tablet Take 1,000 mg by mouth daily.  .Marland Kitchenaspirin 81 MG tablet Take 81 mg by mouth daily.  .Marland Kitchenatorvastatin (LIPITOR) 10 MG tablet TAKE 1 TABLET EVERY DAY  . Blood Glucose Monitoring Suppl (ONETOUCH VERIO IQ SYSTEM) W/DEVICE KIT Use as  instructed.  .Marland KitchenCO-ENZYME Q-10 PO Take 200 mg by mouth daily.   . Dulaglutide (TRULICITY) 04.40MHK/7.4QVSOPN Inject 0.75 mg into the skin once a week.  . hydrochlorothiazide (HYDRODIURIL) 25 MG tablet Take 0.5 tablets (12.5 mg total) by mouth daily.  . indomethacin (INDOCIN) 50 MG capsule TAKE 1 CAPSULE (50 MG TOTAL) BY MOUTH 3 (THREE) TIMES DAILY AS NEEDED.  .Marland KitchenJARDIANCE 10 MG TABS tablet TAKE 1 TABLET EVERY DAY  . lisinopril (ZESTRIL) 40 MG tablet TAKE 1 TABLET EVERY DAY  . metFORMIN (GLUCOPHAGE) 500 MG tablet TAKE 2 TABLETS TWICE A DAY WITH MEALS  . metoprolol succinate (TOPROL-XL) 50 MG 24 hr tablet TAKE 1 TABLET DAILY WITH OR IMMEDIATELY FOLLOWING A MEAL  . Multiple Vitamin (MULTIVITAMIN) tablet Take 1 tablet by mouth daily. One A Day Mens   Facility-Administered Encounter Medications as of 10/28/2020  Medication  . zoster vaccine live (PF) (ZOSTAVAX) injection 19,400 Units    Allergies (verified) Patient has no known allergies.   History: Past Medical History:  Diagnosis Date  . COLONIC POLYPS, HX OF 02/03/2009  . DIAB W/O MENTION COMP TYPE II/UNS TYPE UNCNTRL 02/03/2009  . GERD 02/03/2009  . HYPERTENSION 01/31/2009  . METABOLIC SYNDROME X 79/56/3875  Past Surgical History:  Procedure Laterality Date  . TONSILLECTOMY Bilateral    Family History  Problem Relation Age of Onset  . Cancer Mother        breast  . Diabetes Mother   . Hyperlipidemia Other   . Hypertension Other   . Heart disease Other   . Cancer Other        parent  .  Cancer Sister 63       colon cancer  . Hypertension Father   . Heart disease Father   . Multiple endocrine neoplasia Father    Social History   Socioeconomic History  . Marital status: Married    Spouse name: Not on file  . Number of children: Not on file  . Years of education: Not on file  . Highest education level: Not on file  Occupational History  . Not on file  Tobacco Use  . Smoking status: Former Smoker    Packs/day: 1.00     Years: 5.00    Pack years: 5.00    Types: Cigarettes    Quit date: 01/13/1976    Years since quitting: 44.8  . Smokeless tobacco: Never Used  Vaping Use  . Vaping Use: Never used  Substance and Sexual Activity  . Alcohol use: Not Currently  . Drug use: Not on file  . Sexual activity: Not on file  Other Topics Concern  . Not on file  Social History Narrative  . Not on file   Social Determinants of Health   Financial Resource Strain: Low Risk   . Difficulty of Paying Living Expenses: Not hard at all  Food Insecurity: No Food Insecurity  . Worried About Charity fundraiser in the Last Year: Never true  . Ran Out of Food in the Last Year: Never true  Transportation Needs: No Transportation Needs  . Lack of Transportation (Medical): No  . Lack of Transportation (Non-Medical): No  Physical Activity: Insufficiently Active  . Days of Exercise per Week: 3 days  . Minutes of Exercise per Session: 30 min  Stress: No Stress Concern Present  . Feeling of Stress : Not at all  Social Connections: Socially Integrated  . Frequency of Communication with Friends and Family: More than three times a week  . Frequency of Social Gatherings with Friends and Family: More than three times a week  . Attends Religious Services: More than 4 times per year  . Active Member of Clubs or Organizations: Yes  . Attends Archivist Meetings: More than 4 times per year  . Marital Status: Married    Tobacco Counseling Counseling given: Not Answered   Clinical Intake:  Pre-visit preparation completed: Yes  Pain : No/denies pain     Nutritional Risks: None Diabetes: Yes CBG done?: No Did pt. bring in CBG monitor from home?: No  How often do you need to have someone help you when you read instructions, pamphlets, or other written materials from your doctor or pharmacy?: 1 - Never What is the last grade level you completed in school?: college  Diabetic?yes Nutrition Risk  Assessment:  Has the patient had any N/V/D within the last 2 months?  No  Does the patient have any non-healing wounds?  No  Has the patient had any unintentional weight loss or weight gain?  No   Diabetes:  Is the patient diabetic?  Yes  If diabetic, was a CBG obtained today?  No  Did the patient bring in their glucometer from home?  No  How often do you monitor your CBG's? Pt states he does not check them often.   Financial Strains and Diabetes Management:  Are you having any financial strains with the device, your supplies or your medication? No .  Does the patient want to be seen by Chronic Care Management for management of their diabetes?  No  Would the patient like to be referred to  a Nutritionist or for Diabetic Management?  No   Diabetic Exams:  Diabetic Eye Exam: Overdue for diabetic eye exam. Pt has been advised about the importance in completing this exam. Patient advised to call and schedule an eye exam. Diabetic Foot Exam: Overdue, Pt has been advised about the importance in completing this exam. Pt is scheduled for diabetic foot exam on next office visit 11/19/2020 _0  with Dr. Elease Hashimoto.   Interpreter Needed?: No  Information entered by :: [place] of Daily Living In your present state of health, do you have any difficulty performing the following activities: 10/28/2020  Hearing? N  Vision? N  Difficulty concentrating or making decisions? N  Walking or climbing stairs? N  Dressing or bathing? N  Doing errands, shopping? N  Preparing Food and eating ? N  Using the Toilet? N  In the past six months, have you accidently leaked urine? N  Do you have problems with loss of bowel control? N  Managing your Medications? N  Managing your Finances? N  Housekeeping or managing your Housekeeping? N  Some recent data might be hidden    Patient Care Team: Eulas Post, MD as PCP - General [place], Sharma Covert, [facility] (Inactive) as Pharmacist  (Pharmacist)  Indicate any recent Medical Services you may have received from other than [facility] providers in the past year (date may be approximate).     Assessment:   This is a routine wellness examination for Ava.  Hearing/Vision screen  Hearing Screening   _1  _2  _3  _4  _5  _6  _7  _8  _9   Right ear:           Left ear:           Vision Screening Comments: Annual eye exams   Dietary issues and exercise activities discussed: Current Exercise Habits: Home exercise routine, Type of exercise: walking, Time (Minutes): 30, Frequency (Times/Week): 3, Weekly Exercise (Minutes/Week): 90, Intensity: Mild, Exercise limited by: None identified  Goals    . Chronic Care Management     CARE PLAN ENTRY  Current Barriers:  . Chronic Disease Management support, education, and care coordination needs related to Hypertension, Hyperlipidemia, and Diabetes   Hypertension . Pharmacist Clinical Goal(s): o Over the next 90 days, patient will work with PharmD and providers to maintain BP goal <140/90 . Current regimen:  . HCTZ 25 mg 0.5 tablet once daily . Lisinopril 40 mg 1 tablet once daily . Metoprolol succinate 50 mg 1 tablet once daily with or immediately following a meal . Interventions: o Discussed the importance of routine blood pressure monitoring o Discussed the benefits of heart healthy diet and low salt intake . Patient self care activities - Over the next 90 days, patient will: o Check BP once a week, document, and provide at future appointments o Ensure daily salt intake < 2300 mg/day  Hyperlipidemia . Pharmacist Clinical Goal(s): o Over the next 90 days, patient will work with PharmD and providers to maintain LDL goal < 100 . Current regimen:  o Atorvastatin 10 mg 1 tablet once daily . Interventions: o Advised the patient to take statin at bedtime optimal results . Patient self care activities - Over the next 90 days, patient will: o Take  atorvastatin 10 mg daily at bedtime  Diabetes . Pharmacist Clinical Goal(s): o Over the next 90 days, patient will work with PharmD and providers to maintain A1c goal <7% . Current regimen:  . Trulicity 3.41 DQ/2.2 ml inject 0.75 mg into the skin  once a week . Jardiance 10 mg 1 tablet once daily  . Metformin 500 mg 2 tablets twice a day with meal Interventions: o Discussed the benefits of lowering A1c further when it comes to cost savings o Advised to check fasting blood sugar every morning . Patient self care activities - Over the next 90 days, patient will: o Check blood sugar in the morning before eating or drinking, document, and provide at future appointments o Contact provider with any episodes of hypoglycemia o Continue to eat portion size meals and cut down on sugar and carbohydrates  Medication management . Pharmacist Clinical Goal(s): o Over the next 90 days, patient will work with PharmD and providers to maintain optimal medication adherence . Current pharmacy: [facility] . Interventions o Comprehensive medication review performed. o Continue current medication management strategy . Patient self care activities - Over the next 90 days, patient will: o Take medications as prescribed o Report any questions or concerns to PharmD and/or provider(s)  Initial goal documentation     . DIET - INCREASE WATER INTAKE      Depression Screen PHQ 2/9 Scores 10/28/2020 05/21/2020 12/25/2019 09/17/2019 05/09/2019 03/03/2018 03/01/2017  PHQ - 2 Score 0 0 0 0 0 0 0  PHQ- 9 Score - - 0 - - - -    Fall Risk Fall Risk  10/28/2020 05/21/2020 09/17/2019 05/09/2019 03/03/2018  Falls in the past year? 0 - 0 0 No  Number falls in past yr: 0 - - - -  Injury with Fall? 0 0 - - -  Follow up - Falls evaluation completed - - -    FALL RISK PREVENTION PERTAINING TO THE HOME:  Any stairs in or around the home? Yes  If so, are there any without handrails? Yes  Home free of loose throw rugs in  walkways, pet beds, electrical cords, etc? Yes  Adequate lighting in your home to reduce risk of falls? Yes   ASSISTIVE DEVICES UTILIZED TO PREVENT FALLS:  Life alert? No  Use of a cane, walker or w/c? No  Grab bars in the bathroom? Yes  Shower chair or bench in shower? Yes  Elevated toilet seat or a handicapped toilet? Yes     Cognitive Function:     Normal cognitive status assessed by direct observation by this Nurse Health Advisor. No abnormalities found.      Immunizations Immunization History  Administered Date(s) Administered  . Fluad Quad(high Dose 65+) 05/09/2019, 05/21/2020  . Influenza Split 05/04/2011, 04/13/2014  . Influenza, High Dose Seasonal PF 05/12/2017, 05/05/2018  . Influenza-Unspecified 05/01/2015, 04/24/2016  . Moderna Sars-Covid-2 Vaccination 11/28/2019, 12/26/2019  . Pneumococcal Conjugate-13 02/23/2016  . Pneumococcal Polysaccharide-23 01/17/2012, 03/03/2018  . Td 02/04/2006  . Tdap 01/17/2012  . Zoster 01/17/2012  . Zoster Recombinat (Shingrix) 03/26/2017, 07/05/2017    TDAP status: Up to date  Flu Vaccine status: Up to date  Pneumococcal vaccine status: Up to date  Covid-19 vaccine status: Completed vaccines  Qualifies for Shingles Vaccine? Yes   Zostavax completed Yes   Shingrix Completed?: Yes  Screening Tests Health Maintenance  Topic Date Due  . COVID-19 Vaccine (3 - Booster for Moderna series) 06/26/2020  . HEMOGLOBIN A1C  11/18/2020  . OPHTHALMOLOGY EXAM  01/09/2021  . INFLUENZA VACCINE  02/16/2021  . FOOT EXAM  05/21/2021  . TETANUS/TDAP  01/16/2022  . COLONOSCOPY (Pts 45-26yr Insurance coverage will need to be confirmed)  04/17/2029  . Hepatitis C Screening  Completed  . PNA vac Low  Risk Adult  Completed  . HPV VACCINES  Aged Out    Health Maintenance  Health Maintenance Due  Topic Date Due  . COVID-19 Vaccine (3 - Booster for Moderna series) 06/26/2020    Colorectal cancer screening: Type of screening:  Colonoscopy. Completed 04/18/2019. Repeat every 10 years  Lung Cancer Screening: (Low Dose CT Chest recommended if Age 1-80 years, 30 pack-year currently smoking OR have quit w/in 15years.) does not qualify.   Lung Cancer Screening Referral: n/a  Additional Screening:  Hepatitis C Screening: does qualify; Completed 08/0//2017  Vision Screening: Recommended annual ophthalmology exams for early detection of glaucoma and other disorders of the eye. Is the patient up to date with their annual eye exam?  Yes  Who is the provider or what is the name of the office in which the patient attends annual eye exams? [facility] If pt is not established with a provider, would they like to be referred to a provider to establish care? No .   Dental Screening: Recommended annual dental exams for proper oral hygiene  Community Resource Referral / Chronic Care Management: CRR required this visit?  No   CCM required this visit?  No      Plan:     I have personally reviewed and noted the following in the patient's chart:   . Medical and social history . Use of alcohol, tobacco or illicit drugs  . Current medications and supplements . Functional ability and status . Nutritional status . Physical activity . Advanced directives . List of other physicians . Hospitalizations, surgeries, and ER visits in previous 12 months . Vitals . Screenings to include cognitive, depression, and falls . Referrals and appointments  In addition, I have reviewed and discussed with patient certain preventive protocols, quality metrics, and best practice recommendations. A written personalized care plan for preventive services as well as general preventive health recommendations were provided to patient.     Randel Pigg, LPN   07/20/5850   Nurse Notes:  Recommended pt perform regular finger blood glucose checks to monitor and record BS

## 2020-10-28 ENCOUNTER — Ambulatory Visit (INDEPENDENT_AMBULATORY_CARE_PROVIDER_SITE_OTHER): Payer: Medicare HMO

## 2020-10-28 DIAGNOSIS — Z0000 Encounter for general adult medical examination without abnormal findings: Secondary | ICD-10-CM

## 2020-10-28 NOTE — Patient Instructions (Addendum)
Jared Byrd , Thank you for taking time to come for your Medicare Wellness Visit. I appreciate your ongoing commitment to your health goals. Please review the following plan we discussed and let me know if I can assist you in the future.   Screening recommendations/referrals: Colonoscopy: Current Due 04/17/2029 Recommended yearly ophthalmology/optometry visit for glaucoma screening and checkup Recommended yearly dental visit for hygiene and checkup  Vaccinations: Influenza vaccine:  Current Due Fall 2022 Pneumococcal vaccine: completed series Tdap vaccine: current Due 01/16/2022 Shingles vaccine: completed series  Advanced directives: will provided a copy   Conditions/risks identified: Reccommended regular finger stick blood glucose checks to monitor and record Blood sugar levels  Next appointment: 11/19/2020  @700am  with [place]   Preventive Care 45 Years and Older, Male Preventive care refers to lifestyle choices and visits with your health care provider that can promote health and wellness. What does preventive care include?  A yearly physical exam. This is also called an annual well check.  Dental exams once or twice a year.  Routine eye exams. Ask your health care provider how often you should have your eyes checked.  Personal lifestyle choices, including:  Daily care of your teeth and gums.  Regular physical activity.  Eating a healthy diet.  Avoiding tobacco and drug use.  Limiting alcohol use.  Practicing safe sex.  Taking low doses of aspirin every day.  Taking vitamin and mineral supplements as recommended by your health care provider. What happens during an annual well check? The services and screenings done by your health care provider during your annual well check will depend on your age, overall health, lifestyle risk factors, and family history of disease. Counseling  Your health care provider may ask you questions about your:  Alcohol use.  Tobacco  use.  Drug use.  Emotional well-being.  Home and relationship well-being.  Sexual activity.  Eating habits.  History of falls.  Memory and ability to understand (cognition).  Work and work Statistician. Screening  You may have the following tests or measurements:  Height, weight, and BMI.  Blood pressure.  Lipid and cholesterol levels. These may be checked every 5 years, or more frequently if you are over 70 years old.  Skin check.  Lung cancer screening. You may have this screening every year starting at age 70 if you have a 30-pack-year history of smoking and currently smoke or have quit within the past 15 years.  Fecal occult blood test (FOBT) of the stool. You may have this test every year starting at age 70.  Flexible sigmoidoscopy or colonoscopy. You may have a sigmoidoscopy every 5 years or a colonoscopy every 10 years starting at age 70.  Prostate cancer screening. Recommendations will vary depending on your family history and other risks.  Hepatitis C blood test.  Hepatitis B blood test.  Sexually transmitted disease (STD) testing.  Diabetes screening. This is done by checking your blood sugar (glucose) after you have not eaten for a while (fasting). You may have this done every 1-3 years.  Abdominal aortic aneurysm (AAA) screening. You may need this if you are a current or former smoker.  Osteoporosis. You may be screened starting at age 23 if you are at high risk. Talk with your health care provider about your test results, treatment options, and if necessary, the need for more tests. Vaccines  Your health care provider may recommend certain vaccines, such as:  Influenza vaccine. This is recommended every year.  Tetanus, diphtheria, and acellular pertussis (  Tdap, Td) vaccine. You may need a Td booster every 10 years.  Zoster vaccine. You may need this after age 70.  Pneumococcal 13-valent conjugate (PCV13) vaccine. One dose is recommended after age  70.  Pneumococcal polysaccharide (PPSV23) vaccine. One dose is recommended after age 70. Talk to your health care provider about which screenings and vaccines you need and how often you need them. This information is not intended to replace advice given to you by your health care provider. Make sure you discuss any questions you have with your health care provider. Document Released: 08/01/2015 Document Revised: 03/24/2016 Document Reviewed: 05/06/2015 Elsevier Interactive Patient Education  2017 [place] Prevention in the Home Falls can cause injuries. They can happen to people of all ages. There are many things you can do to make your home safe and to help prevent falls. What can I do on the outside of my home?  Regularly fix the edges of walkways and driveways and fix any cracks.  Remove anything that might make you trip as you walk through a door, such as a raised step or threshold.  Trim any bushes or trees on the path to your home.  Use bright outdoor lighting.  Clear any walking paths of anything that might make someone trip, such as rocks or tools.  Regularly check to see if handrails are loose or broken. Make sure that both sides of any steps have handrails.  Any raised decks and porches should have guardrails on the edges.  Have any leaves, snow, or ice cleared regularly.  Use sand or salt on walking paths during winter.  Clean up any spills in your garage right away. This includes oil or grease spills. What can I do in the bathroom?  Use night lights.  Install grab bars by the toilet and in the tub and shower. Do not use towel bars as grab bars.  Use non-skid mats or decals in the tub or shower.  If you need to sit down in the shower, use a plastic, non-slip stool.  Keep the floor dry. Clean up any water that spills on the floor as soon as it happens.  Remove soap buildup in the tub or shower regularly.  Attach bath mats securely with double-sided  non-slip rug tape.  Do not have throw rugs and other things on the floor that can make you trip. What can I do in the bedroom?  Use night lights.  Make sure that you have a light by your bed that is easy to reach.  Do not use any sheets or blankets that are too big for your bed. They should not hang down onto the floor.  Have a firm chair that has side arms. You can use this for support while you get dressed.  Do not have throw rugs and other things on the floor that can make you trip. What can I do in the kitchen?  Clean up any spills right away.  Avoid walking on wet floors.  Keep items that you use a lot in easy-to-reach places.  If you need to reach something above you, use a strong step stool that has a grab bar.  Keep electrical cords out of the way.  Do not use floor polish or wax that makes floors slippery. If you must use wax, use non-skid floor wax.  Do not have throw rugs and other things on the floor that can make you trip. What can I do with my stairs?  Do  not leave any items on the stairs.  Make sure that there are handrails on both sides of the stairs and use them. Fix handrails that are broken or loose. Make sure that handrails are as long as the stairways.  Check any carpeting to make sure that it is firmly attached to the stairs. Fix any carpet that is loose or worn.  Avoid having throw rugs at the top or bottom of the stairs. If you do have throw rugs, attach them to the floor with carpet tape.  Make sure that you have a light switch at the top of the stairs and the bottom of the stairs. If you do not have them, ask someone to add them for you. What else can I do to help prevent falls?  Wear shoes that:  Do not have high heels.  Have rubber bottoms.  Are comfortable and fit you well.  Are closed at the toe. Do not wear sandals.  If you use a stepladder:  Make sure that it is fully opened. Do not climb a closed stepladder.  Make sure that both  sides of the stepladder are locked into place.  Ask someone to hold it for you, if possible.  Clearly mark and make sure that you can see:  Any grab bars or handrails.  First and last steps.  Where the edge of each step is.  Use tools that help you move around (mobility aids) if they are needed. These include:  Canes.  Walkers.  Scooters.  Crutches.  Turn on the lights when you go into a dark area. Replace any light bulbs as soon as they burn out.  Set up your furniture so you have a clear path. Avoid moving your furniture around.  If any of your floors are uneven, fix them.  If there are any pets around you, be aware of where they are.  Review your medicines with your doctor. Some medicines can make you feel dizzy. This can increase your chance of falling. Ask your doctor what other things that you can do to help prevent falls. This information is not intended to replace advice given to you by your health care provider. Make sure you discuss any questions you have with your health care provider. Document Released: 05/01/2009 Document Revised: 12/11/2015 Document Reviewed: 08/09/2014 Elsevier Interactive Patient Education  2017 [employer].

## 2020-11-19 ENCOUNTER — Other Ambulatory Visit: Payer: Self-pay

## 2020-11-19 ENCOUNTER — Ambulatory Visit (INDEPENDENT_AMBULATORY_CARE_PROVIDER_SITE_OTHER): Payer: Medicare HMO | Admitting: Family Medicine

## 2020-11-19 ENCOUNTER — Encounter: Payer: Self-pay | Admitting: Family Medicine

## 2020-11-19 VITALS — BP 110/74 | HR 68 | Temp 98.2°F | Wt 227.5 lb

## 2020-11-19 DIAGNOSIS — E1140 Type 2 diabetes mellitus with diabetic neuropathy, unspecified: Secondary | ICD-10-CM | POA: Diagnosis not present

## 2020-11-19 LAB — POCT GLYCOSYLATED HEMOGLOBIN (HGB A1C): Hemoglobin A1C: 6.9 % — AB (ref 4.0–5.6)

## 2020-11-19 NOTE — Patient Instructions (Signed)
Consider discontinuation of aspirin based on American Heart Association Guidelines.

## 2020-11-19 NOTE — Progress Notes (Signed)
Established Patient Office Visit  Subjective:  Patient ID: Jared Byrd, male    DOB: [birth year]  Age: 70 y.o. MRN: [id number]  CC:  Chief Complaint  Patient presents with  . Diabetes  . Hypertension    HPI [name] presents for follow-up type 2 diabetes.  He had physical back in November and A1c at that time was 7.4%.  He elected to try to make some lifestyle changes without increasing medications.  A1c today is back down to 6.9%.  This is somewhat surprising since his weight is actually up 5 pounds.  He has tried to reduce high glycemic foods.  He has been little more active in the spring.  Currently on combination therapy with Trulicity, Jardiance, and metformin.  He is apparently still taking baby aspirin with no prior history of cerebrovascular disease or cardiac disease.  Past Medical History:  Diagnosis Date  . COLONIC POLYPS, HX OF 02/03/2009  . DIAB W/O MENTION COMP TYPE II/UNS TYPE UNCNTRL 02/03/2009  . GERD 02/03/2009  . HYPERTENSION 01/31/2009  . METABOLIC SYNDROME X 5/85/9292    Past Surgical History:  Procedure Laterality Date  . TONSILLECTOMY Bilateral     Family History  Problem Relation Age of Onset  . Cancer Mother        breast  . Diabetes Mother   . Hyperlipidemia Other   . Hypertension Other   . Heart disease Other   . Cancer Other        parent  . Cancer Sister 53       colon cancer  . Hypertension Father   . Heart disease Father   . Multiple endocrine neoplasia Father     Social History   Socioeconomic History  . Marital status: Married    Spouse name: Not on file  . Number of children: Not on file  . Years of education: Not on file  . Highest education level: Not on file  Occupational History  . Not on file  Tobacco Use  . Smoking status: Former Smoker    Packs/day: 1.00    Years: 5.00    Pack years: 5.00    Types: Cigarettes    Quit date: 01/13/1976    Years since quitting: 44.8  . Smokeless tobacco: Never Used  Vaping Use   . Vaping Use: Never used  Substance and Sexual Activity  . Alcohol use: Not Currently  . Drug use: Not on file  . Sexual activity: Not on file  Other Topics Concern  . Not on file  Social History Narrative  . Not on file   Social Determinants of Health   Financial Resource Strain: Low Risk   . Difficulty of Paying Living Expenses: Not hard at all  Food Insecurity: No Food Insecurity  . Worried About Charity fundraiser in the Last Year: Never true  . Ran Out of Food in the Last Year: Never true  Transportation Needs: No Transportation Needs  . Lack of Transportation (Medical): No  . Lack of Transportation (Non-Medical): No  Physical Activity: Insufficiently Active  . Days of Exercise per Week: 3 days  . Minutes of Exercise per Session: 30 min  Stress: No Stress Concern Present  . Feeling of Stress : Not at all  Social Connections: Socially Integrated  . Frequency of Communication with Friends and Family: More than three times a week  . Frequency of Social Gatherings with Friends and Family: More than three times a week  . Attends Religious Services: More  than 4 times per year  . Active Member of Clubs or Organizations: Yes  . Attends Archivist Meetings: More than 4 times per year  . Marital Status: Married  Human resources officer Violence: Not At Risk  . Fear of Current or Ex-Partner: No  . Emotionally Abused: No  . Physically Abused: No  . Sexually Abused: No    Outpatient Medications Prior to Visit  Medication Sig Dispense Refill  . Ascorbic Acid (VITAMIN C) 1000 MG tablet Take 1,000 mg by mouth daily.    Marland Kitchen atorvastatin (LIPITOR) 10 MG tablet TAKE 1 TABLET EVERY DAY 90 tablet 2  . Blood Glucose Monitoring Suppl (ONETOUCH VERIO IQ SYSTEM) W/DEVICE KIT Use as instructed. 1 kit 0  . CO-ENZYME Q-10 PO Take 200 mg by mouth daily.     . Dulaglutide (TRULICITY) 3.21 YY/4.8GN SOPN Inject 0.75 mg into the skin once a week. 12 pen 3  . hydrochlorothiazide (HYDRODIURIL) 25  MG tablet Take 0.5 tablets (12.5 mg total) by mouth daily. 90 tablet 0  . indomethacin (INDOCIN) 50 MG capsule TAKE 1 CAPSULE (50 MG TOTAL) BY MOUTH 3 (THREE) TIMES DAILY AS NEEDED. 30 capsule 2  . JARDIANCE 10 MG TABS tablet TAKE 1 TABLET EVERY DAY 90 tablet 1  . lisinopril (ZESTRIL) 40 MG tablet TAKE 1 TABLET EVERY DAY 90 tablet 1  . metFORMIN (GLUCOPHAGE) 500 MG tablet TAKE 2 TABLETS TWICE A DAY WITH MEALS 360 tablet 1  . metoprolol succinate (TOPROL-XL) 50 MG 24 hr tablet TAKE 1 TABLET DAILY WITH OR IMMEDIATELY FOLLOWING A MEAL 90 tablet 2  . Multiple Vitamin (MULTIVITAMIN) tablet Take 1 tablet by mouth daily. One A Day Mens    . aspirin 81 MG tablet Take 81 mg by mouth daily.     Facility-Administered Medications Prior to Visit  Medication Dose Route Frequency Provider Last Rate Last Admin  . zoster vaccine live (PF) (ZOSTAVAX) injection 19,400 Units  0.65 mL Subcutaneous Once Beauregard Jarrells, Alinda Sierras, MD        No Known Allergies  ROS Review of Systems  Constitutional: Negative for fever.  Respiratory: Negative for shortness of breath.   Cardiovascular: Negative for chest pain.  Endocrine: Negative for polydipsia and polyuria.      Objective:    Physical Exam Vitals reviewed.  Constitutional:      Appearance: Normal appearance.  Cardiovascular:     Rate and Rhythm: Normal rate and regular rhythm.  Pulmonary:     Effort: Pulmonary effort is normal.     Breath sounds: Normal breath sounds.  Neurological:     Mental Status: He is alert.     BP 110/74 (BP Location: Left Arm, Patient Position: Sitting, Cuff Size: Normal)   Pulse 68   Temp 98.2 F (36.8 C) (Oral)   Wt 227 lb 8 oz (103.2 kg)   SpO2 94%   BMI 30.02 kg/m  Wt Readings from Last 3 Encounters:  11/19/20 227 lb 8 oz (103.2 kg)  05/21/20 222 lb (100.7 kg)  02/13/20 (!) 219 lb 8 oz (99.6 kg)     There are no preventive care reminders to display for this patient.  There are no preventive care reminders to  display for this patient.  Lab Results  Component Value Date   TSH 2.98 05/21/2020   Lab Results  Component Value Date   WBC 6.6 05/21/2020   HGB 15.4 05/21/2020   HCT 45.8 05/21/2020   MCV 91.8 05/21/2020   PLT 273.0 05/21/2020  Lab Results  Component Value Date   NA 135 05/21/2020   K 4.4 05/21/2020   CO2 32 05/21/2020   GLUCOSE 129 (H) 05/21/2020   BUN 19 05/21/2020   CREATININE 1.12 05/21/2020   BILITOT 1.0 05/21/2020   ALKPHOS 65 05/21/2020   AST 16 05/21/2020   ALT 19 05/21/2020   PROT 7.1 05/21/2020   ALBUMIN 4.6 05/21/2020   CALCIUM 9.8 05/21/2020   GFR 66.95 05/21/2020   Lab Results  Component Value Date   CHOL 129 05/21/2020   Lab Results  Component Value Date   HDL 43.70 05/21/2020   Lab Results  Component Value Date   LDLCALC 54 05/21/2020   Lab Results  Component Value Date   TRIG 158.0 (H) 05/21/2020   Lab Results  Component Value Date   CHOLHDL 3 05/21/2020   Lab Results  Component Value Date   HGBA1C 6.9 (A) 11/19/2020      Assessment & Plan:   Problem List Items Addressed This Visit      Unprioritized   Type 2 diabetes mellitus with diabetic neuropathy, unspecified ([place]) - Primary   Relevant Orders   POCT glycosylated hemoglobin (Hb A1C) (Completed)    Improved control today with A1c 6.9%.  We did discuss options of possibly increasing Trulicity to help with additional weight gain but he would like to wait at this time.  We recommend he consider discontinuing aspirin based on newer guidelines for American Heart Association  -We will plan 36-monthfollow-up and recheck A1c then with his physical  No orders of the defined types were placed in this encounter.   Follow-up: Return in about 6 months (around 05/22/2021).    BCarolann Littler MD

## 2020-11-26 ENCOUNTER — Other Ambulatory Visit: Payer: Self-pay | Admitting: Family Medicine

## 2020-11-26 DIAGNOSIS — Z0000 Encounter for general adult medical examination without abnormal findings: Secondary | ICD-10-CM

## 2021-02-26 ENCOUNTER — Other Ambulatory Visit: Payer: Self-pay | Admitting: Family Medicine

## 2021-08-11 ENCOUNTER — Ambulatory Visit (INDEPENDENT_AMBULATORY_CARE_PROVIDER_SITE_OTHER): Payer: Medicare HMO | Admitting: Family Medicine

## 2021-08-11 ENCOUNTER — Encounter: Payer: Self-pay | Admitting: Family Medicine

## 2021-08-11 VITALS — BP 138/88 | HR 80 | Temp 98.2°F | Wt 229.2 lb

## 2021-08-11 DIAGNOSIS — Z0000 Encounter for general adult medical examination without abnormal findings: Secondary | ICD-10-CM | POA: Diagnosis not present

## 2021-08-11 DIAGNOSIS — E8881 Metabolic syndrome: Secondary | ICD-10-CM

## 2021-08-11 DIAGNOSIS — E1140 Type 2 diabetes mellitus with diabetic neuropathy, unspecified: Secondary | ICD-10-CM | POA: Diagnosis not present

## 2021-08-11 LAB — CBC WITH DIFFERENTIAL/PLATELET
Basophils Absolute: 0 10*3/uL (ref 0.0–0.1)
Basophils Relative: 0.2 % (ref 0.0–3.0)
Eosinophils Absolute: 0.3 10*3/uL (ref 0.0–0.7)
Eosinophils Relative: 3.1 % (ref 0.0–5.0)
HCT: 46.5 % (ref 39.0–52.0)
Hemoglobin: 15.1 g/dL (ref 13.0–17.0)
Lymphocytes Relative: 18.1 % (ref 12.0–46.0)
Lymphs Abs: 1.5 10*3/uL (ref 0.7–4.0)
MCHC: 32.6 g/dL (ref 30.0–36.0)
MCV: 91.2 fl (ref 78.0–100.0)
Monocytes Absolute: 0.5 10*3/uL (ref 0.1–1.0)
Monocytes Relative: 5.7 % (ref 3.0–12.0)
Neutro Abs: 6 10*3/uL (ref 1.4–7.7)
Neutrophils Relative %: 72.9 % (ref 43.0–77.0)
Platelets: 252 10*3/uL (ref 150.0–400.0)
RBC: 5.09 Mil/uL (ref 4.22–5.81)
RDW: 13.6 % (ref 11.5–15.5)
WBC: 8.2 10*3/uL (ref 4.0–10.5)

## 2021-08-11 LAB — HEPATIC FUNCTION PANEL
ALT: 18 U/L (ref 0–53)
AST: 19 U/L (ref 0–37)
Albumin: 4.6 g/dL (ref 3.5–5.2)
Alkaline Phosphatase: 65 U/L (ref 39–117)
Bilirubin, Direct: 0.1 mg/dL (ref 0.0–0.3)
Total Bilirubin: 1.1 mg/dL (ref 0.2–1.2)
Total Protein: 7.3 g/dL (ref 6.0–8.3)

## 2021-08-11 LAB — BASIC METABOLIC PANEL
BUN: 20 mg/dL (ref 6–23)
CO2: 26 mEq/L (ref 19–32)
Calcium: 9.9 mg/dL (ref 8.4–10.5)
Chloride: 98 mEq/L (ref 96–112)
Creatinine, Ser: 1.09 mg/dL (ref 0.40–1.50)
GFR: 68.57 mL/min (ref >60.00)
Glucose, Bld: 148 mg/dL — ABNORMAL HIGH (ref 70–99)
Potassium: 4.2 mEq/L (ref 3.5–5.1)
Sodium: 135 mEq/L (ref 135–145)

## 2021-08-11 LAB — HEMOGLOBIN A1C: Hgb A1c MFr Bld: 8.9 % — ABNORMAL HIGH (ref 4.6–6.5)

## 2021-08-11 LAB — LIPID PANEL
Cholesterol: 228 mg/dL — ABNORMAL HIGH (ref 0–200)
HDL: 45 mg/dL (ref >39.00)
NonHDL: 183.44
Total CHOL/HDL Ratio: 5
Triglycerides: 322 mg/dL — ABNORMAL HIGH (ref 0.0–149.0)
VLDL: 64.4 mg/dL — ABNORMAL HIGH (ref 0.0–40.0)

## 2021-08-11 LAB — LDL CHOLESTEROL, DIRECT: Direct LDL: 147 mg/dL

## 2021-08-11 LAB — MICROALBUMIN / CREATININE URINE RATIO
Creatinine,U: 71 mg/dL
Microalb Creat Ratio: 1.3 mg/g (ref 0.0–30.0)
Microalb, Ur: 0.9 mg/dL (ref 0.0–1.9)

## 2021-08-11 LAB — TSH: TSH: 2.44 u[IU]/mL (ref 0.35–5.50)

## 2021-08-11 NOTE — Progress Notes (Signed)
Established Patient Office Visit  Subjective:  Patient ID: Jared Byrd, male    DOB: [birth year]  Age: 71 y.o. MRN: [id number]  CC:  Chief Complaint  Patient presents with   Annual Exam    HPI Jared Byrd presents for physical exam.  Generally doing well.  He has chronic problems including hypertension, GERD, type 2 diabetes, metabolic syndrome X, history of gout part, dyslipidemia.  Compliant with medications.  Diabetes has been generally well controlled.  He is overdue for eye exam but this has been scheduled.  He did not take his blood pressure medications this morning and thinks that is why his blood pressure may be slightly elevated.  Family history reviewed-mother had breast cancer and diabetes.  She apparently died of complications of heart valve surgery.  Father had CABG early 39s.  He had a sister with colon cancer age 76.  Social history-he is married.  Smoked only briefly years ago.  No alcohol.  Health maintenance reviewed-  -Prior hepatitis C screening negative -Shingrix vaccine already given -Pneumonia vaccines complete -Flu vaccine already given -Next colonoscopy due 2030 -Tetanus due later this year  Past Medical History:  Diagnosis Date   COLONIC POLYPS, HX OF 02/03/2009   DIAB W/O MENTION COMP TYPE II/UNS TYPE UNCNTRL 02/03/2009   GERD 02/03/2009   HYPERTENSION 6/33/3545   METABOLIC SYNDROME X 01/11/6388    Past Surgical History:  Procedure Laterality Date   TONSILLECTOMY Bilateral     Family History  Problem Relation Age of Onset   Heart disease Mother        "valve" disease   Cancer Mother        breast   Diabetes Mother    Hypertension Father    Heart disease Father    Multiple endocrine neoplasia Father    Cancer Sister 20       colon cancer   Hyperlipidemia Other    Hypertension Other    Heart disease Other    Cancer Other        parent    Social History   Socioeconomic History   Marital status: Married    Spouse name: Not on  file   Number of children: Not on file   Years of education: Not on file   Highest education level: Not on file  Occupational History   Not on file  Tobacco Use   Smoking status: Former    Packs/day: 1.00    Years: 5.00    Pack years: 5.00    Types: Cigarettes    Quit date: 01/13/1976    Years since quitting: 45.6   Smokeless tobacco: Never  Vaping Use   Vaping Use: Never used  Substance and Sexual Activity   Alcohol use: Not Currently   Drug use: Not on file   Sexual activity: Not on file  Other Topics Concern   Not on file  Social History Narrative   Not on file   Social Determinants of Health   Financial Resource Strain: Low Risk    Difficulty of Paying Living Expenses: Not hard at all  Food Insecurity: No Food Insecurity   Worried About Charity fundraiser in the Last Year: Never true   Arboriculturist in the Last Year: Never true  Transportation Needs: Not on file  Physical Activity: Insufficiently Active   Days of Exercise per Week: 3 days   Minutes of Exercise per Session: 30 min  Stress: No Stress Concern Present   Feeling of  Stress : Not at all  Social Connections: Socially Integrated   Frequency of Communication with Friends and Family: More than three times a week   Frequency of Social Gatherings with Friends and Family: More than three times a week   Attends Religious Services: More than 4 times per year   Active Member of [employer] or Organizations: Yes   Attends Music therapist: More than 4 times per year   Marital Status: Married  Human resources officer Violence: Not At Risk   Fear of Current or Ex-Partner: No   Emotionally Abused: No   Physically Abused: No   Sexually Abused: No    Outpatient Medications Prior to Visit  Medication Sig Dispense Refill   Ascorbic Acid (VITAMIN C) 1000 MG tablet Take 1,000 mg by mouth daily.     atorvastatin (LIPITOR) 10 MG tablet TAKE 1 TABLET EVERY DAY 90 tablet 0   Blood Glucose Monitoring Suppl (ONETOUCH  VERIO IQ SYSTEM) W/DEVICE KIT Use as instructed. 1 kit 0   CO-ENZYME Q-10 PO Take 200 mg by mouth daily.      hydrochlorothiazide (HYDRODIURIL) 25 MG tablet Take 0.5 tablets (12.5 mg total) by mouth daily. 90 tablet 0   indomethacin (INDOCIN) 50 MG capsule TAKE 1 CAPSULE (50 MG TOTAL) BY MOUTH 3 (THREE) TIMES DAILY AS NEEDED. 30 capsule 2   JARDIANCE 10 MG TABS tablet TAKE 1 TABLET EVERY DAY 90 tablet 1   lisinopril (ZESTRIL) 40 MG tablet TAKE 1 TABLET EVERY DAY 90 tablet 1   metFORMIN (GLUCOPHAGE) 500 MG tablet TAKE 2 TABLETS TWICE A DAY WITH MEALS 360 tablet 1   metoprolol succinate (TOPROL-XL) 50 MG 24 hr tablet TAKE 1 TABLET DAILY WITH OR IMMEDIATELY FOLLOWING A MEAL 90 tablet 0   Multiple Vitamin (MULTIVITAMIN) tablet Take 1 tablet by mouth daily. One A Day Mens     TRULICITY 9.56 OZ/3.0QM SOPN INJECT 0.75 MG INTO THE SKIN ONCE A WEEK. 12 mL 1   Facility-Administered Medications Prior to Visit  Medication Dose Route Frequency Provider Last Rate Last Admin   zoster vaccine live (PF) (ZOSTAVAX) injection 19,400 Units  0.65 mL Subcutaneous Once Shaneta Cervenka, Alinda Sierras, MD        No Known Allergies  ROS Review of Systems  Constitutional:  Negative for activity change, appetite change, fatigue, fever and unexpected weight change.  HENT:  Negative for congestion, ear pain and trouble swallowing.   Eyes:  Negative for pain and visual disturbance.  Respiratory:  Negative for cough, chest tightness, shortness of breath and wheezing.   Cardiovascular:  Negative for chest pain, palpitations and leg swelling.  Gastrointestinal:  Negative for abdominal distention, abdominal pain, blood in stool, constipation, diarrhea, nausea, rectal pain and vomiting.  Endocrine: Negative for polydipsia and polyuria.  Genitourinary:  Negative for dysuria, hematuria and testicular pain.  Musculoskeletal:  Negative for arthralgias and joint swelling.  Skin:  Negative for rash.  Neurological:  Negative for dizziness,  syncope, weakness, light-headedness and headaches.  Hematological:  Negative for adenopathy.  Psychiatric/Behavioral:  Negative for confusion and dysphoric mood.      Objective:    Physical Exam Constitutional:      General: He is not in acute distress.    Appearance: He is well-developed.  HENT:     Head: Normocephalic and atraumatic.     Right Ear: External ear normal.     Left Ear: External ear normal.  Eyes:     Conjunctiva/sclera: Conjunctivae normal.     Pupils:  Pupils are equal, round, and reactive to light.  Neck:     Thyroid: No thyromegaly.  Cardiovascular:     Rate and Rhythm: Normal rate and regular rhythm.     Heart sounds: Normal heart sounds. No murmur heard. Pulmonary:     Effort: No respiratory distress.     Breath sounds: No wheezing or rales.  Abdominal:     General: Bowel sounds are normal. There is no distension.     Palpations: Abdomen is soft. There is no mass.     Tenderness: There is no abdominal tenderness. There is no guarding or rebound.  Musculoskeletal:     Cervical back: Normal range of motion and neck supple.     Right lower leg: No edema.     Left lower leg: No edema.  Lymphadenopathy:     Cervical: No cervical adenopathy.  Skin:    Findings: No rash.     Comments: Feet reveal no skin lesions. Good distal foot pulses. Good capillary refill. No calluses. Normal sensation   Neurological:     Mental Status: He is alert and oriented to person, place, and time.     Cranial Nerves: No cranial nerve deficit.     Deep Tendon Reflexes: Reflexes normal.    BP 138/88 (BP Location: Left Arm, Cuff Size: Normal)    Pulse 80    Temp 98.2 F (36.8 C) (Oral)    Wt 229 lb 3.2 oz (104 kg)    SpO2 99%    BMI 30.24 kg/m  Wt Readings from Last 3 Encounters:  08/11/21 229 lb 3.2 oz (104 kg)  11/19/20 227 lb 8 oz (103.2 kg)  05/21/20 222 lb (100.7 kg)     Health Maintenance Due  Topic Date Due   COVID-19 Vaccine (3 - Moderna risk series) 01/23/2020    OPHTHALMOLOGY EXAM  01/09/2021   HEMOGLOBIN A1C  05/22/2021    There are no preventive care reminders to display for this patient.  Lab Results  Component Value Date   TSH 2.98 05/21/2020   Lab Results  Component Value Date   WBC 6.6 05/21/2020   HGB 15.4 05/21/2020   HCT 45.8 05/21/2020   MCV 91.8 05/21/2020   PLT 273.0 05/21/2020   Lab Results  Component Value Date   NA 135 05/21/2020   K 4.4 05/21/2020   CO2 32 05/21/2020   GLUCOSE 129 (H) 05/21/2020   BUN 19 05/21/2020   CREATININE 1.12 05/21/2020   BILITOT 1.0 05/21/2020   ALKPHOS 65 05/21/2020   AST 16 05/21/2020   ALT 19 05/21/2020   PROT 7.1 05/21/2020   ALBUMIN 4.6 05/21/2020   CALCIUM 9.8 05/21/2020   GFR 66.95 05/21/2020   Lab Results  Component Value Date   CHOL 129 05/21/2020   Lab Results  Component Value Date   HDL 43.70 05/21/2020   Lab Results  Component Value Date   LDLCALC 54 05/21/2020   Lab Results  Component Value Date   TRIG 158.0 (H) 05/21/2020   Lab Results  Component Value Date   CHOLHDL 3 05/21/2020   Lab Results  Component Value Date   HGBA1C 6.9 (A) 11/19/2020      Assessment & Plan:   Problem List Items Addressed This Visit       Unprioritized   Type 2 diabetes mellitus with diabetic neuropathy, unspecified ([facility]) - Primary   Relevant Orders   Hemoglobin A1c   Microalbumin / creatinine urine ratio   METABOLIC SYNDROME X  Other Visit Diagnoses     Physical exam       Relevant Orders   Basic metabolic panel   Lipid panel   CBC with Differential/Platelet   TSH   Hepatic function panel   Hemoglobin A1c   Microalbumin / creatinine urine ratio     -Obtain labs as above -Needs diabetic eye exam -Check urine microalbumin screen -Consider tetanus later this year -We will plan routine 55-monthfollow-up for his diabetes was indicated earlier based on labs -Monitor blood pressure at home and be in touch if consistently greater than 130/80.  Blood pressure  slightly up this morning but he did not take his medications.  No orders of the defined types were placed in this encounter.   Follow-up: No follow-ups on file.    BCarolann Littler MD

## 2021-08-11 NOTE — Patient Instructions (Signed)
Monitor blood pressure and be in touch if consistently > 140/90.   

## 2021-08-13 ENCOUNTER — Telehealth: Payer: Self-pay | Admitting: Family Medicine

## 2021-08-13 NOTE — Telephone Encounter (Signed)
Called spoke with patient about lab results.

## 2021-08-13 NOTE — Telephone Encounter (Signed)
Pt call and stated he is returning your call and want at call back.

## 2021-08-17 ENCOUNTER — Other Ambulatory Visit: Payer: Self-pay

## 2021-08-17 DIAGNOSIS — E1140 Type 2 diabetes mellitus with diabetic neuropathy, unspecified: Secondary | ICD-10-CM

## 2021-08-17 DIAGNOSIS — E785 Hyperlipidemia, unspecified: Secondary | ICD-10-CM

## 2021-08-17 MED ORDER — TRULICITY 1.5 MG/0.5ML ~~LOC~~ SOAJ: 1.5000 mg | SUBCUTANEOUS | 0 refills | Status: DC

## 2021-08-17 MED ORDER — ATORVASTATIN CALCIUM 20 MG PO TABS: 20.0000 mg | ORAL_TABLET | Freq: Every day | ORAL | 0 refills | Status: DC

## 2021-08-27 ENCOUNTER — Other Ambulatory Visit: Payer: Self-pay | Admitting: Family Medicine

## 2021-08-27 DIAGNOSIS — Z0000 Encounter for general adult medical examination without abnormal findings: Secondary | ICD-10-CM

## 2021-09-02 ENCOUNTER — Telehealth: Payer: Self-pay | Admitting: Family Medicine

## 2021-09-02 DIAGNOSIS — E1140 Type 2 diabetes mellitus with diabetic neuropathy, unspecified: Secondary | ICD-10-CM

## 2021-09-02 DIAGNOSIS — E785 Hyperlipidemia, unspecified: Secondary | ICD-10-CM

## 2021-09-02 MED ORDER — TRULICITY 1.5 MG/0.5ML ~~LOC~~ SOAJ: 1.5000 mg | SUBCUTANEOUS | 3 refills | Status: DC

## 2021-09-02 MED ORDER — ATORVASTATIN CALCIUM 20 MG PO TABS: 20.0000 mg | ORAL_TABLET | Freq: Every day | ORAL | 3 refills | Status: DC

## 2021-09-02 NOTE — Telephone Encounter (Signed)
Pt is calling and medications needs to go to atorvastatin (LIPITOR) 20 MG tablet  and Dulaglutide (TRULICITY) 1.5 ON/6.2XB [place], [place] Phone:  [phone]  Fax:  [phone]    Not [facility]

## 2021-09-02 NOTE — Telephone Encounter (Signed)
Rx sent in

## 2021-09-14 DIAGNOSIS — E119 Type 2 diabetes mellitus without complications: Secondary | ICD-10-CM | POA: Diagnosis not present

## 2021-09-14 DIAGNOSIS — I10 Essential (primary) hypertension: Secondary | ICD-10-CM | POA: Diagnosis not present

## 2021-09-14 DIAGNOSIS — Z0100 Encounter for examination of eyes and vision without abnormal findings: Secondary | ICD-10-CM | POA: Diagnosis not present

## 2021-09-14 DIAGNOSIS — H2513 Age-related nuclear cataract, bilateral: Secondary | ICD-10-CM | POA: Diagnosis not present

## 2021-09-14 LAB — HM DIABETES EYE EXAM

## 2021-09-18 ENCOUNTER — Encounter: Payer: Self-pay | Admitting: Family Medicine

## 2021-10-26 ENCOUNTER — Telehealth: Payer: Self-pay | Admitting: Family Medicine

## 2021-10-26 NOTE — Telephone Encounter (Signed)
Left message for patient to call back and schedule Medicare Annual Wellness Visit (AWV) either virtually or in office. Left  my jabber number [phone] ? ? ?Last AWV 10/28/20 ?please schedule at anytime with [facility] Nurse Health Advisor 1 or 2 ? ? ? ?

## 2021-11-03 ENCOUNTER — Ambulatory Visit (INDEPENDENT_AMBULATORY_CARE_PROVIDER_SITE_OTHER): Payer: Medicare HMO

## 2021-11-03 VITALS — Ht 73.0 in | Wt 220.0 lb

## 2021-11-03 DIAGNOSIS — Z0000 Encounter for general adult medical examination without abnormal findings: Secondary | ICD-10-CM | POA: Diagnosis not present

## 2021-11-03 NOTE — Patient Instructions (Addendum)
?Jared Byrd , ?Thank you for taking time to come for your Medicare Wellness Visit. I appreciate your ongoing commitment to your health goals. Please review the following plan we discussed and let me know if I can assist you in the future.  ? ?These are the goals we discussed: ? Goals   ? ?   Chronic Care Management   ?   CARE PLAN ENTRY ? ?Current Barriers:  ?Chronic Disease Management support, education, and care coordination needs related to Hypertension, Hyperlipidemia, and Diabetes ?  ?Hypertension ?Pharmacist Clinical Goal(s): ?Over the next 90 days, patient will work with PharmD and providers to maintain BP goal <140/90 ?Current regimen:  ?HCTZ 25 mg 0.5 tablet once daily ?Lisinopril 40 mg 1 tablet once daily ?Metoprolol succinate 50 mg 1 tablet once daily with or immediately following a meal ?Interventions: ?Discussed the importance of routine blood pressure monitoring ?Discussed the benefits of heart healthy diet and low salt intake ?Patient self care activities - Over the next 90 days, patient will: ?Check BP once a week, document, and provide at future appointments ?Ensure daily salt intake < 2300 mg/day ? ?Hyperlipidemia ?Pharmacist Clinical Goal(s): ?Over the next 90 days, patient will work with PharmD and providers to maintain LDL goal < 100 ?Current regimen:  ?Atorvastatin 10 mg 1 tablet once daily ?Interventions: ?Advised the patient to take statin at bedtime optimal results ?Patient self care activities - Over the next 90 days, patient will: ?Take atorvastatin 10 mg daily at bedtime ? ?Diabetes ?Pharmacist Clinical Goal(s): ?Over the next 90 days, patient will work with PharmD and providers to maintain A1c goal <7% ?Current regimen:  ?Trulicity 0.25 [place]/7.0 ml inject 0.75 mg into the skin once a week ?Jardiance 10 mg 1 tablet once daily  ?Metformin 500 mg 2 tablets twice a day with meal ?Interventions: ?Discussed the benefits of lowering A1c further when it comes to cost savings ?Advised to check  fasting blood sugar every morning ?Patient self care activities - Over the next 90 days, patient will: ?Check blood sugar in the morning before eating or drinking, document, and provide at future appointments ?Contact provider with any episodes of hypoglycemia ?Continue to eat portion size meals and cut down on sugar and carbohydrates ? ?Medication management ?Pharmacist Clinical Goal(s): ?Over the next 90 days, patient will work with PharmD and providers to maintain optimal medication adherence ?Current pharmacy: [facility] ?Interventions ?Comprehensive medication review performed. ?Continue current medication management strategy ?Patient self care activities - Over the next 90 days, patient will: ?Take medications as prescribed ?Report any questions or concerns to PharmD and/or provider(s) ? ?Initial goal documentation ? ?  ?   DIET - INCREASE WATER INTAKE   ?   Weight (lb) < 200 lb (90.7 kg) (pt-stated)   ? ?  ?  ?This is a list of the screening recommended for you and due dates:  ?Health Maintenance  ?Topic Date Due  ? COVID-19 Vaccine (3 - Moderna risk series) 11/19/2021*  ? Tetanus Vaccine  01/16/2022  ? Hemoglobin A1C  02/08/2022  ? Flu Shot  02/16/2022  ? Complete foot exam   08/11/2022  ? Eye exam for diabetics  09/14/2022  ? Colon Cancer Screening  04/17/2029  ? Pneumonia Vaccine  Completed  ? Hepatitis C Screening: USPSTF Recommendation to screen - Ages 71-79 yo.  Completed  ? Zoster (Shingles) Vaccine  Completed  ? HPV Vaccine  Aged Out  ?*Topic was postponed. The date shown is not the original due date.  ? ?  Advanced directives: Yes Patient will bring copy ? ?Conditions/risks identified: None ? ?Next appointment: Follow up in one year for your annual wellness visit.  ? ?Preventive Care 71 Years and Older, Male ?Preventive care refers to lifestyle choices and visits with your health care provider that can promote health and wellness. ?What does preventive care include? ?A yearly physical exam. This  is also called an annual well check. ?Dental exams once or twice a year. ?Routine eye exams. Ask your health care provider how often you should have your eyes checked. ?Personal lifestyle choices, including: ?Daily care of your teeth and gums. ?Regular physical activity. ?Eating a healthy diet. ?Avoiding tobacco and drug use. ?Limiting alcohol use. ?Practicing safe sex. ?Taking low doses of aspirin every day. ?Taking vitamin and mineral supplements as recommended by your health care provider. ?What happens during an annual well check? ?The services and screenings done by your health care provider during your annual well check will depend on your age, overall health, lifestyle risk factors, and family history of disease. ?Counseling  ?Your health care provider may ask you questions about your: ?Alcohol use. ?Tobacco use. ?Drug use. ?Emotional well-being. ?Home and relationship well-being. ?Sexual activity. ?Eating habits. ?History of falls. ?Memory and ability to understand (cognition). ?Work and work Statistician. ?Screening  ?You may have the following tests or measurements: ?Height, weight, and BMI. ?Blood pressure. ?Lipid and cholesterol levels. These may be checked every 5 years, or more frequently if you are over 71 years old. ?Skin check. ?Lung cancer screening. You may have this screening every year starting at age 71 if you have a 30-pack-year history of smoking and currently smoke or have quit within the past 15 years. ?Fecal occult blood test (FOBT) of the stool. You may have this test every year starting at age 39. ?Flexible sigmoidoscopy or colonoscopy. You may have a sigmoidoscopy every 5 years or a colonoscopy every 10 years starting at age 44. ?Prostate cancer screening. Recommendations will vary depending on your family history and other risks. ?Hepatitis C blood test. ?Hepatitis B blood test. ?Sexually transmitted disease (STD) testing. ?Diabetes screening. This is done by checking your blood sugar  (glucose) after you have not eaten for a while (fasting). You may have this done every 1-3 years. ?Abdominal aortic aneurysm (AAA) screening. You may need this if you are a current or former smoker. ?Osteoporosis. You may be screened starting at age 37 if you are at high risk. ?Talk with your health care provider about your test results, treatment options, and if necessary, the need for more tests. ?Vaccines  ?Your health care provider may recommend certain vaccines, such as: ?Influenza vaccine. This is recommended every year. ?Tetanus, diphtheria, and acellular pertussis (Tdap, Td) vaccine. You may need a Td booster every 10 years. ?Zoster vaccine. You may need this after age 53. ?Pneumococcal 13-valent conjugate (PCV13) vaccine. One dose is recommended after age 31. ?Pneumococcal polysaccharide (PPSV23) vaccine. One dose is recommended after age 38. ?Talk to your health care provider about which screenings and vaccines you need and how often you need them. ?This information is not intended to replace advice given to you by your health care provider. Make sure you discuss any questions you have with your health care provider. ?Document Released: 08/01/2015 Document Revised: 03/24/2016 Document Reviewed: 05/06/2015 ?Elsevier Interactive Patient Education ? 2017 [place]. ? ?Fall Prevention in the Home ?Falls can cause injuries. They can happen to people of all ages. There are many things you can do to make  your home safe and to help prevent falls. ?What can I do on the outside of my home? ?Regularly fix the edges of walkways and driveways and fix any cracks. ?Remove anything that might make you trip as you walk through a door, such as a raised step or threshold. ?Trim any bushes or trees on the path to your home. ?Use bright outdoor lighting. ?Clear any walking paths of anything that might make someone trip, such as rocks or tools. ?Regularly check to see if handrails are loose or broken. Make sure that both  sides of any steps have handrails. ?Any raised decks and porches should have guardrails on the edges. ?Have any leaves, snow, or ice cleared regularly. ?Use sand or salt on walking paths during winter. ?[place]a

## 2021-11-03 NOTE — Progress Notes (Signed)
? ?Subjective:  ? Jusitn Byrd is a 71 y.o. male who presents for Medicare Annual/Subsequent preventive examination. ? ?Review of Systems    ?Virtual Visit via Telephone Note ? ?I connected with  [name] on 11/03/21 at  3:45 PM EDT by telephone and verified that I am speaking with the correct person using two identifiers. ? ?Location: ?Patient: Home ?Provider: Office ?Persons participating in the virtual visit: patient/Nurse Health Advisor ?  ?I discussed the limitations, risks, security and privacy concerns of performing an evaluation and management service by telephone and the availability of in person appointments. The patient expressed understanding and agreed to proceed. ? ?Interactive audio and video telecommunications were attempted between this nurse and patient, however failed, due to patient having technical difficulties OR patient did not have access to video capability.  We continued and completed visit with audio only. ? ?Some vital signs may be absent or patient reported.  ? ?[name], LPN  ?Cardiac Risk Factors include: advanced age (>70mn, >>64women);diabetes mellitus;male gender ? ?   ?Objective:  ?  ?Today's Vitals  ? 11/03/21 1550  ?Weight: 220 lb (99.8 kg)  ?Height: 6' 1"  (1.854 m)  ? ?Body mass index is 29.03 kg/m?. ? ? ?  11/03/2021  ?  3:58 PM 10/28/2020  ?  3:49 PM  ?Advanced Directives  ?Does Patient Have a Medical Advance Directive? Yes Yes  ?Type of AParamedicof ADubberlyLiving will Living will;Healthcare Power of Attorney  ?Does patient want to make changes to medical advance directive? No - Patient declined No - Patient declined  ?Copy of HSnyderin Chart? No - copy requested No - copy requested  ? ? ?Current Medications (verified) ?Outpatient Encounter Medications as of 11/03/2021  ?Medication Sig  ? Ascorbic Acid (VITAMIN C) 1000 MG tablet Take 1,000 mg by mouth daily.  ? atorvastatin (LIPITOR) 20 MG tablet Take 1 tablet (20  mg total) by mouth daily.  ? Blood Glucose Monitoring Suppl (ONETOUCH VERIO IQ SYSTEM) W/DEVICE KIT Use as instructed.  ? CO-ENZYME Q-10 PO Take 200 mg by mouth daily.   ? Dulaglutide (TRULICITY) 1.5 MIR/5.1OASOPN Inject 1.5 mg into the skin once a week.  ? hydrochlorothiazide (HYDRODIURIL) 25 MG tablet Take 0.5 tablets (12.5 mg total) by mouth daily.  ? indomethacin (INDOCIN) 50 MG capsule TAKE 1 CAPSULE (50 MG TOTAL) BY MOUTH 3 (THREE) TIMES DAILY AS NEEDED.  ? JARDIANCE 10 MG TABS tablet TAKE 1 TABLET EVERY DAY  ? lisinopril (ZESTRIL) 40 MG tablet TAKE 1 TABLET EVERY DAY  ? metFORMIN (GLUCOPHAGE) 500 MG tablet TAKE 2 TABLETS TWICE A DAY WITH MEALS  ? metoprolol succinate (TOPROL-XL) 50 MG 24 hr tablet TAKE 1 TABLET DAILY WITH OR IMMEDIATELY FOLLOWING A MEAL  ? Multiple Vitamin (MULTIVITAMIN) tablet Take 1 tablet by mouth daily. One A Day Mens  ? ?Facility-Administered Encounter Medications as of 11/03/2021  ?Medication  ? zoster vaccine live (PF) (ZOSTAVAX) injection 19,400 Units  ? ? ?Allergies (verified) ?Patient has no known allergies.  ? ?History: ?Past Medical History:  ?Diagnosis Date  ? COLONIC POLYPS, HX OF 02/03/2009  ? DIAB W/O MENTION COMP TYPE II/UNS TYPE UNCNTRL 02/03/2009  ? GERD 02/03/2009  ? HYPERTENSION 01/31/2009  ? METABOLIC SYNDROME X 74/16/6063 ? ?Past Surgical History:  ?Procedure Laterality Date  ? TONSILLECTOMY Bilateral   ? ?Family History  ?Problem Relation Age of Onset  ? Heart disease Mother   ?     "valve"  disease  ? Cancer Mother   ?     breast  ? Diabetes Mother   ? Hypertension Father   ? Heart disease Father   ? Multiple endocrine neoplasia Father   ? Cancer Sister 80  ?     colon cancer  ? Hyperlipidemia Other   ? Hypertension Other   ? Heart disease Other   ? Cancer Other   ?     parent  ? ?Social History  ? ?Socioeconomic History  ? Marital status: Married  ?  Spouse name: Not on file  ? Number of children: Not on file  ? Years of education: Not on file  ? Highest education level:  Not on file  ?Occupational History  ? Not on file  ?Tobacco Use  ? Smoking status: Former  ?  Packs/day: 1.00  ?  Years: 5.00  ?  Pack years: 5.00  ?  Types: Cigarettes  ?  Quit date: 01/13/1976  ?  Years since quitting: 45.8  ? Smokeless tobacco: Never  ?Vaping Use  ? Vaping Use: Never used  ?Substance and Sexual Activity  ? Alcohol use: Not Currently  ? Drug use: Not on file  ? Sexual activity: Not on file  ?Other Topics Concern  ? Not on file  ?Social History Narrative  ? Not on file  ? ?Social Determinants of Health  ? ?Financial Resource Strain: Low Risk   ? Difficulty of Paying Living Expenses: Not hard at all  ?Food Insecurity: No Food Insecurity  ? Worried About Charity fundraiser in the Last Year: Never true  ? Ran Out of Food in the Last Year: Never true  ?Transportation Needs: No Transportation Needs  ? Lack of Transportation (Medical): No  ? Lack of Transportation (Non-Medical): No  ?Physical Activity: Insufficiently Active  ? Days of Exercise per Week: 1 day  ? Minutes of Exercise per Session: 20 min  ?Stress: No Stress Concern Present  ? Feeling of Stress : Not at all  ?Social Connections: Socially Integrated  ? Frequency of Communication with Friends and Family: Once a week  ? Frequency of Social Gatherings with Friends and Family: Three times a week  ? Attends Religious Services: More than 4 times per year  ? Active Member of Clubs or Organizations: Yes  ? Attends Archivist Meetings: More than 4 times per year  ? Marital Status: Married  ? ? ?Clinical Intake: ? ?Pre-visit preparation completed: Yes ?Nutrition Risk Assessment: ? ?Has the patient had any N/V/D within the last 2 months?  No  ?Does the patient have any non-healing wounds?  No  ?Has the patient had any unintentional weight loss or weight gain?  No  ? ?Diabetes: ? ?Is the patient diabetic?  Yes  ?If diabetic, was a CBG obtained today?  Yes  ?Did the patient bring in their glucometer from home?  No  ?How often do you monitor your  CBG's? Prn..  ? ?Financial Strains and Diabetes Management: ? ?Are you having any financial strains with the device, your supplies or your medication? No .  ?Does the patient want to be seen by Chronic Care Management for management of their diabetes?  No  ?Would the patient like to be referred to a Nutritionist or for Diabetic Management?  No  ? ?Diabetic Exams: ? ?Diabetic Eye Exam: Completed Yes. Overdue for diabetic eye exam. Pt has been advised about the importance in completing this exam. A referral has been placed today. Message  sent to referral coordinator for scheduling purposes. Advised pt to expect a call from office referred to regarding appt. ? ?Diabetic Foot Exam: Completed Yes. Pt has been advised about the importance in completing this exam. Pt is scheduled for diabetic foot exam on Followed by PCP.   ? ?Nutritional Risks: None ?Diabetes: Yes ?CBG done?: No ?Did pt. bring in CBG monitor from home?: No ? ?How often do you need to have someone help you when you read instructions, pamphlets, or other written materials from your doctor or pharmacy?: 1 - Never ? ?Diabetic?Yes ? ? ?Activities of Daily Living ? ?  11/03/2021  ?  3:57 PM 10/30/2021  ?  8:44 AM  ?In your present state of health, do you have any difficulty performing the following activities:  ?Hearing? 0 0  ?Vision? 0 0  ?Difficulty concentrating or making decisions? 0 0  ?Walking or climbing stairs? 0 0  ?Dressing or bathing? 0 0  ?Doing errands, shopping? 0 0  ?Preparing Food and eating ? N N  ?Using the Toilet? N N  ?In the past six months, have you accidently leaked urine? N N  ?Do you have problems with loss of bowel control? N N  ?Managing your Medications? N N  ?Managing your Finances? N N  ?Housekeeping or managing your Housekeeping? N N  ? ? ?Patient Care Team: ?Eulas Post, MD as PCP - General ?Eli Hose, [facility] (Inactive) as Pharmacist (Pharmacist) ? ?Indicate any recent Medical Services you may have received from other  than [facility] providers in the past year (date may be approximate). ? ?   ?Assessment:  ? This is a routine wellness examination for [place]. ? ?Hearing/Vision screen ?Hearing Screening - Comments:: No hearin

## 2021-11-18 ENCOUNTER — Ambulatory Visit (INDEPENDENT_AMBULATORY_CARE_PROVIDER_SITE_OTHER): Payer: Medicare HMO | Admitting: Family Medicine

## 2021-11-18 ENCOUNTER — Encounter: Payer: Self-pay | Admitting: Family Medicine

## 2021-11-18 ENCOUNTER — Other Ambulatory Visit: Payer: Self-pay | Admitting: Family Medicine

## 2021-11-18 VITALS — BP 116/64 | HR 67 | Temp 98.4°F | Ht 73.0 in | Wt 223.2 lb

## 2021-11-18 DIAGNOSIS — E1140 Type 2 diabetes mellitus with diabetic neuropathy, unspecified: Secondary | ICD-10-CM | POA: Diagnosis not present

## 2021-11-18 DIAGNOSIS — I10 Essential (primary) hypertension: Secondary | ICD-10-CM

## 2021-11-18 DIAGNOSIS — E785 Hyperlipidemia, unspecified: Secondary | ICD-10-CM

## 2021-11-18 LAB — POCT GLYCOSYLATED HEMOGLOBIN (HGB A1C): Hemoglobin A1C: 7.9 % — AB (ref 4.0–5.6)

## 2021-11-18 NOTE — Patient Instructions (Signed)
A1C has improved to 7.9% ? ?Try to lose some weight and let's see if we can get closer to 7 in 3 months.  ?

## 2021-11-18 NOTE — Addendum Note (Signed)
Addended by: Octavio Manns E on: 11/18/2021 07:41 AM ? ? Modules accepted: Orders ? ?

## 2021-11-18 NOTE — Addendum Note (Signed)
Addended by: Octavio Manns E on: 11/18/2021 07:42 AM ? ? Modules accepted: Orders ? ?

## 2021-11-18 NOTE — Progress Notes (Signed)
? ?Established Patient Office Visit ? ?Subjective   ?Patient ID: Jared Byrd, male    DOB: 11-03-1950  Age: 71 y.o. MRN: [id number] ? ?Chief Complaint  ?Patient presents with  ? Follow-up  ? ? ?HPI ? ? ?Jared Byrd is seen today for follow-up regarding type 2 diabetes and hyperlipidemia.  Refer to note from January.  His A1c had jumped up to 8.9%.  We increased his Trulicity 1.5.  Tolerating well.  Compliant with medications.  Also remains on metformin and Jardiance.  Not monitoring blood sugars regularly.  His weight is down 6 pounds from last visit ? ?Lipids also surprisingly had jumped up with LDL 147.  He states that he is taking rosuvastatin 20 mg daily regularly.  He does not recall taking higher dose statins in the past.  Denies any myalgias.  His blood pressures been well controlled on lisinopril 40 mg daily, metoprolol, and HCTZ. ? ?Past Medical History:  ?Diagnosis Date  ? COLONIC POLYPS, HX OF 02/03/2009  ? DIAB W/O MENTION COMP TYPE II/UNS TYPE UNCNTRL 02/03/2009  ? GERD 02/03/2009  ? HYPERTENSION 01/31/2009  ? METABOLIC SYNDROME X 3/81/8299  ? ?Past Surgical History:  ?Procedure Laterality Date  ? TONSILLECTOMY Bilateral   ? ? reports that he quit smoking about 45 years ago. His smoking use included cigarettes. He has a 5.00 pack-year smoking history. He has never used smokeless tobacco. He reports that he does not currently use alcohol. No history on file for drug use. ?family history includes Cancer in his mother and another family member; Cancer (age of onset: 18) in his sister; Diabetes in his mother; Heart disease in his father, mother, and another family member; Hyperlipidemia in an other family member; Hypertension in his father and another family member; Multiple endocrine neoplasia in his father. ?No Known Allergies ? ?Review of Systems  ?Respiratory:  Negative for shortness of breath.   ?Cardiovascular:  Negative for chest pain and leg swelling.  ? ?  ?Objective:  ?  ? ?BP 116/64 (BP Location:  Left Arm, Patient Position: Sitting, Cuff Size: Normal)   Pulse 67   Temp 98.4 ?F (36.9 ?C) (Oral)   Ht '6\' 1"'$  (1.854 m)   Wt 223 lb 3.2 oz (101.2 kg)   SpO2 95%   BMI 29.45 kg/m?  ?BP Readings from Last 3 Encounters:  ?11/18/21 116/64  ?08/11/21 138/88  ?11/19/20 110/74  ? ?Wt Readings from Last 3 Encounters:  ?11/18/21 223 lb 3.2 oz (101.2 kg)  ?11/03/21 220 lb (99.8 kg)  ?08/11/21 229 lb 3.2 oz (104 kg)  ? ?  ? ?Physical Exam ?Vitals reviewed.  ?Constitutional:   ?   Appearance: Normal appearance.  ?Cardiovascular:  ?   Rate and Rhythm: Normal rate and regular rhythm.  ?Pulmonary:  ?   Effort: Pulmonary effort is normal.  ?   Breath sounds: Normal breath sounds. No wheezing or rales.  ?Musculoskeletal:  ?   Right lower leg: No edema.  ?   Left lower leg: No edema.  ?Neurological:  ?   Mental Status: He is alert.  ? ? ? ?Results for orders placed or performed in visit on 11/18/21  ?POCT glycosylated hemoglobin (Hb A1C)  ?Result Value Ref Range  ? Hemoglobin A1C 7.9 (A) 4.0 - 5.6 %  ? HbA1c POC (<> result, manual entry)    ? HbA1c, POC (prediabetic range)    ? HbA1c, POC (controlled diabetic range)    ? ? ?Last lipids ?Lab Results  ?Component Value  Date  ? CHOL 228 (H) 08/11/2021  ? HDL 45.00 08/11/2021  ? [place] 54 05/21/2020  ? LDLDIRECT 147.0 08/11/2021  ? TRIG 322.0 (H) 08/11/2021  ? CHOLHDL 5 08/11/2021  ? ?Last hemoglobin A1c ?Lab Results  ?Component Value Date  ? HGBA1C 7.9 (A) 11/18/2021  ? ?  ? ?The 10-year ASCVD risk score (Arnett DK, et al., 2019) is: 36.6% ? ?  ?Assessment & Plan:  ? ?Problem List Items Addressed This Visit   ? ?  ? Unprioritized  ? Type 2 diabetes mellitus with diabetic neuropathy, unspecified ([place]) - Primary  ? Relevant Orders  ? POCT glycosylated hemoglobin (Hb A1C) (Completed)  ? Dyslipidemia  ? Relevant Orders  ? Lipid panel  ?Type 2 diabetes has improved slightly with A1c 8.9% to 7.9%.  We discussed further titration of Trulicity versus continued lifestyle modification he  prefers the latter.  We will recheck in 3 months.  If not closer to goal at that time consider further titration of Trulicity versus additional medication ? ?Recheck lipid.  If LDL remains elevated titrate Lipitor to 40 mg daily.  Also might consider addition of Zetia. ? ?His blood pressure remains well controlled.  Continue current regimen of HCTZ 25 mg daily, Toprol-XL 50 mg once daily, and lisinopril 40 mg daily. ? ?Return in about 3 months (around 02/18/2022).  ? ? ?Carolann Littler, MD ? ?

## 2022-06-04 ENCOUNTER — Other Ambulatory Visit: Payer: Self-pay | Admitting: Family Medicine

## 2022-06-04 DIAGNOSIS — Z0000 Encounter for general adult medical examination without abnormal findings: Secondary | ICD-10-CM

## 2022-07-20 ENCOUNTER — Encounter: Payer: Self-pay | Admitting: Family Medicine

## 2022-08-13 ENCOUNTER — Ambulatory Visit (INDEPENDENT_AMBULATORY_CARE_PROVIDER_SITE_OTHER): Payer: Medicare HMO | Admitting: Family Medicine

## 2022-08-13 ENCOUNTER — Encounter: Payer: Self-pay | Admitting: Family Medicine

## 2022-08-13 VITALS — BP 144/70 | HR 67 | Temp 98.5°F | Ht 72.84 in | Wt 222.4 lb

## 2022-08-13 DIAGNOSIS — E8881 Metabolic syndrome: Secondary | ICD-10-CM

## 2022-08-13 DIAGNOSIS — Z0000 Encounter for general adult medical examination without abnormal findings: Secondary | ICD-10-CM | POA: Diagnosis not present

## 2022-08-13 DIAGNOSIS — Z125 Encounter for screening for malignant neoplasm of prostate: Secondary | ICD-10-CM | POA: Diagnosis not present

## 2022-08-13 DIAGNOSIS — E785 Hyperlipidemia, unspecified: Secondary | ICD-10-CM | POA: Diagnosis not present

## 2022-08-13 DIAGNOSIS — E1140 Type 2 diabetes mellitus with diabetic neuropathy, unspecified: Secondary | ICD-10-CM

## 2022-08-13 LAB — LIPID PANEL
Cholesterol: 128 mg/dL (ref 0–200)
HDL: 43.4 mg/dL (ref >39.00)
LDL Cholesterol: 59 mg/dL (ref 0–99)
NonHDL: 84.96
Total CHOL/HDL Ratio: 3
Triglycerides: 130 mg/dL (ref 0.0–149.0)
VLDL: 26 mg/dL (ref 0.0–40.0)

## 2022-08-13 LAB — BASIC METABOLIC PANEL
BUN: 18 mg/dL (ref 6–23)
CO2: 26 mEq/L (ref 19–32)
Calcium: 9.7 mg/dL (ref 8.4–10.5)
Chloride: 102 mEq/L (ref 96–112)
Creatinine, Ser: 0.99 mg/dL (ref 0.40–1.50)
GFR: 76.42 mL/min (ref >60.00)
Glucose, Bld: 125 mg/dL — ABNORMAL HIGH (ref 70–99)
Potassium: 4.5 mEq/L (ref 3.5–5.1)
Sodium: 137 mEq/L (ref 135–145)

## 2022-08-13 LAB — CBC WITH DIFFERENTIAL/PLATELET
Basophils Absolute: 0 10*3/uL (ref 0.0–0.1)
Basophils Relative: 0.3 % (ref 0.0–3.0)
Eosinophils Absolute: 0.4 10*3/uL (ref 0.0–0.7)
Eosinophils Relative: 4.9 % (ref 0.0–5.0)
HCT: 46.1 % (ref 39.0–52.0)
Hemoglobin: 15.6 g/dL (ref 13.0–17.0)
Lymphocytes Relative: 22.9 % (ref 12.0–46.0)
Lymphs Abs: 1.6 10*3/uL (ref 0.7–4.0)
MCHC: 33.9 g/dL (ref 30.0–36.0)
MCV: 91.3 fl (ref 78.0–100.0)
Monocytes Absolute: 0.5 10*3/uL (ref 0.1–1.0)
Monocytes Relative: 6.7 % (ref 3.0–12.0)
Neutro Abs: 4.7 10*3/uL (ref 1.4–7.7)
Neutrophils Relative %: 65.2 % (ref 43.0–77.0)
Platelets: 270 10*3/uL (ref 150.0–400.0)
RBC: 5.05 Mil/uL (ref 4.22–5.81)
RDW: 13.4 % (ref 11.5–15.5)
WBC: 7.2 10*3/uL (ref 4.0–10.5)

## 2022-08-13 LAB — MICROALBUMIN / CREATININE URINE RATIO
Creatinine,U: 68.1 mg/dL
Microalb Creat Ratio: 3 mg/g (ref 0.0–30.0)
Microalb, Ur: 2 mg/dL — ABNORMAL HIGH (ref 0.0–1.9)

## 2022-08-13 LAB — HEPATIC FUNCTION PANEL
ALT: 17 U/L (ref 0–53)
AST: 16 U/L (ref 0–37)
Albumin: 4.9 g/dL (ref 3.5–5.2)
Alkaline Phosphatase: 77 U/L (ref 39–117)
Bilirubin, Direct: 0.2 mg/dL (ref 0.0–0.3)
Total Bilirubin: 1.2 mg/dL (ref 0.2–1.2)
Total Protein: 7.3 g/dL (ref 6.0–8.3)

## 2022-08-13 LAB — HEMOGLOBIN A1C: Hgb A1c MFr Bld: 7.3 % — ABNORMAL HIGH (ref 4.6–6.5)

## 2022-08-13 LAB — PSA, MEDICARE: PSA: 1.11 ng/ml (ref 0.10–4.00)

## 2022-08-13 MED ORDER — HYDROCHLOROTHIAZIDE 25 MG PO TABS: 12.5000 mg | ORAL_TABLET | Freq: Every day | ORAL | 3 refills | Status: DC

## 2022-08-13 MED ORDER — LISINOPRIL 40 MG PO TABS: 40.0000 mg | ORAL_TABLET | Freq: Every day | ORAL | 3 refills | Status: DC

## 2022-08-13 MED ORDER — METFORMIN HCL 500 MG PO TABS: ORAL_TABLET | ORAL | 3 refills | Status: DC

## 2022-08-13 MED ORDER — ATORVASTATIN CALCIUM 20 MG PO TABS: 20.0000 mg | ORAL_TABLET | Freq: Every day | ORAL | 3 refills | Status: DC

## 2022-08-13 MED ORDER — TRULICITY 1.5 MG/0.5ML ~~LOC~~ SOAJ: 1.5000 mg | SUBCUTANEOUS | 3 refills | Status: DC

## 2022-08-13 MED ORDER — INDOMETHACIN 50 MG PO CAPS: 50.0000 mg | ORAL_CAPSULE | Freq: Three times a day (TID) | ORAL | 2 refills | Status: AC | PRN

## 2022-08-13 MED ORDER — METOPROLOL SUCCINATE ER 50 MG PO TB24: ORAL_TABLET | ORAL | 3 refills | Status: DC

## 2022-08-13 NOTE — Progress Notes (Signed)
Established Patient Office Visit  Subjective   Patient ID: Jared Byrd, male    DOB: Nov 09, 1950  Age: 71 y.o. MRN: [id number]  Chief Complaint  Patient presents with   Annual Exam    HPI   Jared Byrd is seen for physical exam.  He has chronic problems including history of obesity, hypertension, type 2 diabetes, metabolic syndrome, gout, dyslipidemia.  Needs refills of several medications today.  Apparently has been out of HCTZ for about 2 months now which may explain why his blood pressure is up today.  Health maintenance reviewed  -Vaccines up-to-date-exception of tetanus -Colonoscopy reportedly due 2030 -Prior hepatitis C screen negative  Family history-mother had history of breast cancer and type 2 diabetes.  He has a brother with type 2 diabetes.  Father with history of CABG early 27s.  Sister with history of breast cancer and another sister with history of colon cancer.  Social history-he is married.  Smoked only briefly years ago.  No alcohol.  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  Past Medical History:  Diagnosis Date   COLONIC POLYPS, HX OF 02/03/2009   DIAB W/O MENTION COMP TYPE II/UNS TYPE UNCNTRL 02/03/2009   GERD 02/03/2009   HYPERTENSION 10/07/252   METABOLIC SYNDROME X 2/70/6237   Past Surgical History:  Procedure Laterality Date   TONSILLECTOMY Bilateral     reports that he quit smoking about 46 years ago. His smoking use included cigarettes. He has a 5.00 pack-year smoking history. He has never used smokeless tobacco. He reports that he does not currently use alcohol. No history on file for drug use. family history includes Cancer in his mother and another family member; Cancer (age of onset: 63) in his sister; Diabetes in his mother; Heart disease in his father, mother, and another family member; Hyperlipidemia in an other family member; Hypertension in his father and another family member; Multiple endocrine  neoplasia in his father. No Known Allergies  Review of Systems  Constitutional:  Negative for chills, fever, malaise/fatigue and weight loss.  HENT:  Negative for hearing loss.   Eyes:  Negative for blurred vision and double vision.  Respiratory:  Negative for cough and shortness of breath.   Cardiovascular:  Negative for chest pain, palpitations and leg swelling.  Gastrointestinal:  Negative for abdominal pain, blood in stool, constipation and diarrhea.  Genitourinary:  Negative for dysuria.  Skin:  Negative for rash.  Neurological:  Negative for dizziness, speech change, seizures, loss of consciousness and headaches.  Psychiatric/Behavioral:  Negative for depression.       Objective:     BP (!) 144/70 (BP Location: Left Arm, Patient Position: Sitting, Cuff Size: Normal)   Pulse 67   Temp 98.5 F (36.9 C) (Oral)   Ht 6' 0.84" (1.85 m)   Wt 222 lb 6.4 oz (100.9 kg)   SpO2 98%   BMI 29.48 kg/m  BP Readings from Last 3 Encounters:  08/13/22 (!) 144/70  11/18/21 116/64  08/11/21 138/88   Wt Readings from Last 3 Encounters:  08/13/22 222 lb 6.4 oz (100.9 kg)  11/18/21 223 lb 3.2 oz (101.2 kg)  11/03/21 220 lb (99.8 kg)      Physical Exam Vitals reviewed.  Constitutional:      General: He is not in acute distress.    Appearance: He is well-developed.  HENT:     Head: Normocephalic and atraumatic.     Right Ear: External ear normal.  Left Ear: External ear normal.  Eyes:     Conjunctiva/sclera: Conjunctivae normal.     Pupils: Pupils are equal, round, and reactive to light.  Neck:     Thyroid: No thyromegaly.  Cardiovascular:     Rate and Rhythm: Normal rate and regular rhythm.     Heart sounds: Normal heart sounds. No murmur heard. Pulmonary:     Effort: No respiratory distress.     Breath sounds: No wheezing or rales.  Abdominal:     General: Bowel sounds are normal. There is no distension.     Palpations: Abdomen is soft. There is no mass.      Tenderness: There is no abdominal tenderness. There is no guarding or rebound.  Musculoskeletal:     Cervical back: Normal range of motion and neck supple.  Lymphadenopathy:     Cervical: No cervical adenopathy.  Skin:    Findings: No rash.  Neurological:     Mental Status: He is alert and oriented to person, place, and time.     Cranial Nerves: No cranial nerve deficit.      No results found for any visits on 08/13/22.    The 10-year ASCVD risk score (Arnett DK, et al., 2019) is: 48.9%    Assessment & Plan:   Problem List Items Addressed This Visit       Unprioritized   Type 2 diabetes mellitus with diabetic neuropathy, unspecified ([place])   Relevant Medications   atorvastatin (LIPITOR) 20 MG tablet   Dulaglutide (TRULICITY) 1.5 FH/2.1FX SOPN   lisinopril (ZESTRIL) 40 MG tablet   metFORMIN (GLUCOPHAGE) 500 MG tablet   Other Relevant Orders   Basic metabolic panel   Hemoglobin A1c   Microalbumin / creatinine urine ratio   Dyslipidemia   Relevant Medications   atorvastatin (LIPITOR) 20 MG tablet   Dulaglutide (TRULICITY) 1.5 JO/8.3GP SOPN   Other Relevant Orders   Lipid panel   Hepatic function panel   METABOLIC SYNDROME X   Relevant Orders   Basic metabolic panel   Other Visit Diagnoses     Physical exam    -  Primary   Relevant Orders   CBC with Differential/Platelet   Prostate cancer screening       Relevant Orders   PSA, Medicare     -Recommend close monitoring of blood pressure.  Off HCTZ past couple months.  Get back on this and if blood pressure remains greater than 130/80 be in touch  -Check labs as above.  We had a discussion regarding pros and cons of prostate cancer screening over 70 and he would like to still continue with PSA screening.  -Consider tetanus booster at some point this year  -Continue annual flu vaccine  -If A1c not to goal consider further titration of Trulicity to 3 mg subcutaneous once weekly  Return in about 6 months  (around 02/11/2023).    Carolann Littler, MD

## 2022-08-18 ENCOUNTER — Telehealth: Payer: Self-pay | Admitting: Family Medicine

## 2022-08-18 ENCOUNTER — Other Ambulatory Visit: Payer: Self-pay | Admitting: Family Medicine

## 2022-08-18 DIAGNOSIS — Z0000 Encounter for general adult medical examination without abnormal findings: Secondary | ICD-10-CM

## 2022-08-18 MED ORDER — METOPROLOL SUCCINATE ER 50 MG PO TB24: ORAL_TABLET | ORAL | 3 refills | Status: DC

## 2022-08-18 NOTE — Telephone Encounter (Signed)
[employer] is requesting clarity regarding the directions for the metoprolol succinate (TOPROL-XL) 50 MG 24 hr tablet  [phone]  Fax [phone]  Or escribe

## 2022-08-18 NOTE — Telephone Encounter (Signed)
Rx sent with updated directions

## 2022-09-30 DIAGNOSIS — H5203 Hypermetropia, bilateral: Secondary | ICD-10-CM | POA: Diagnosis not present

## 2022-09-30 DIAGNOSIS — H2513 Age-related nuclear cataract, bilateral: Secondary | ICD-10-CM | POA: Diagnosis not present

## 2022-09-30 DIAGNOSIS — E119 Type 2 diabetes mellitus without complications: Secondary | ICD-10-CM | POA: Diagnosis not present

## 2022-09-30 DIAGNOSIS — I10 Essential (primary) hypertension: Secondary | ICD-10-CM | POA: Diagnosis not present

## 2022-09-30 DIAGNOSIS — H35033 Hypertensive retinopathy, bilateral: Secondary | ICD-10-CM | POA: Diagnosis not present

## 2022-09-30 DIAGNOSIS — H524 Presbyopia: Secondary | ICD-10-CM | POA: Diagnosis not present

## 2022-09-30 DIAGNOSIS — H52221 Regular astigmatism, right eye: Secondary | ICD-10-CM | POA: Diagnosis not present

## 2022-09-30 LAB — HM DIABETES EYE EXAM

## 2022-10-05 ENCOUNTER — Encounter: Payer: Self-pay | Admitting: Family Medicine

## 2022-11-05 ENCOUNTER — Ambulatory Visit (INDEPENDENT_AMBULATORY_CARE_PROVIDER_SITE_OTHER): Payer: Medicare HMO

## 2022-11-05 VITALS — Ht 72.84 in | Wt 220.0 lb

## 2022-11-05 DIAGNOSIS — Z0000 Encounter for general adult medical examination without abnormal findings: Secondary | ICD-10-CM | POA: Diagnosis not present

## 2022-11-05 NOTE — Progress Notes (Signed)
Subjective:   Traves Majchrzak is a 72 y.o. male who presents for Medicare Annual/Subsequent preventive examination.  Review of Systems    Virtual Visit via Telephone Note  I connected with  Anita Mcadory on 11/05/22 at 12:30 PM EDT by telephone and verified that I am speaking with the correct person using two identifiers.  Location: Patient: Home Provider: LOffice Persons participating in the virtual visit: patient/Nurse Health Advisor   I discussed the limitations, risks, security and privacy concerns of performing an evaluation and management service by telephone and the availability of in person appointments. The patient expressed understanding and agreed to proceed.  Interactive audio and video telecommunications were attempted between this nurse and patient, however failed, due to patient having technical difficulties OR patient did not have access to video capability.  We continued and completed visit with audio only.  Some vital signs may be absent or patient reported.   Tillie Rung, LPN  Cardiac Risk Factors include: advanced age (>62men, >4 women);diabetes mellitus;hypertension;male gender     Objective:    Today's Vitals   11/05/22 1234  Weight: 220 lb (99.8 kg)  Height: 6' 0.84" (1.85 m)   Body mass index is 29.15 kg/m.     11/05/2022   12:42 PM 11/03/2021    3:58 PM 10/28/2020    3:49 PM  Advanced Directives  Does Patient Have a Medical Advance Directive? Yes Yes Yes  Type of Estate agent of No Name;Living will Healthcare Power of [place];Living will Living will;Healthcare Power of Attorney  Does patient want to make changes to medical advance directive?  No - Patient declined No - Patient declined  Copy of Healthcare Power of Attorney in Chart? No - copy requested No - copy requested No - copy requested    Current Medications (verified) Outpatient Encounter Medications as of 11/05/2022  Medication Sig   Ascorbic Acid (VITAMIN C)  1000 MG tablet Take 1,000 mg by mouth daily.   atorvastatin (LIPITOR) 20 MG tablet Take 1 tablet (20 mg total) by mouth daily.   Blood Glucose Monitoring Suppl (ONETOUCH VERIO IQ SYSTEM) W/DEVICE KIT Use as instructed.   CO-ENZYME Q-10 PO Take 200 mg by mouth daily.    Dulaglutide (TRULICITY) 1.5 MG/0.5ML SOPN Inject 1.5 mg into the skin once a week.   hydrochlorothiazide (HYDRODIURIL) 25 MG tablet Take 0.5 tablets (12.5 mg total) by mouth daily.   indomethacin (INDOCIN) 50 MG capsule Take 1 capsule (50 mg total) by mouth 3 (three) times daily as needed.   JARDIANCE 10 MG TABS tablet TAKE 1 TABLET EVERY DAY   lisinopril (ZESTRIL) 40 MG tablet Take 1 tablet (40 mg total) by mouth daily.   metFORMIN (GLUCOPHAGE) 500 MG tablet TAKE 2 TABLETS TWICE A DAY WITH MEALS   metoprolol succinate (TOPROL-XL) 50 MG 24 hr tablet Take 1 tablet by mouth daily with or immediately following a meal   Multiple Vitamin (MULTIVITAMIN) tablet Take 1 tablet by mouth daily. One A Day Mens   Facility-Administered Encounter Medications as of 11/05/2022  Medication   zoster vaccine live (PF) (ZOSTAVAX) injection 19,400 Units    Allergies (verified) Patient has no known allergies.   History: Past Medical History:  Diagnosis Date   COLONIC POLYPS, HX OF 02/03/2009   DIAB W/O MENTION COMP TYPE II/UNS TYPE UNCNTRL 02/03/2009   GERD 02/03/2009   HYPERTENSION 01/31/2009   METABOLIC SYNDROME X 02/03/2009   Past Surgical History:  Procedure Laterality Date   TONSILLECTOMY Bilateral  Family History  Problem Relation Age of Onset   Heart disease Mother        "valve" disease   Cancer Mother        breast   Diabetes Mother    Hypertension Father    Heart disease Father    Multiple endocrine neoplasia Father    Cancer Sister 20       colon cancer   Hyperlipidemia Other    Hypertension Other    Heart disease Other    Cancer Other        parent   Social History   Socioeconomic History   Marital status:  Married    Spouse name: Not on file   Number of children: Not on file   Years of education: Not on file   Highest education level: Not on file  Occupational History   Not on file  Tobacco Use   Smoking status: Former    Packs/day: 1.00    Years: 5.00    Additional pack years: 0.00    Total pack years: 5.00    Types: Cigarettes    Quit date: 01/13/1976    Years since quitting: 46.8   Smokeless tobacco: Never  Vaping Use   Vaping Use: Never used  Substance and Sexual Activity   Alcohol use: Not Currently   Drug use: Not on file   Sexual activity: Not on file  Other Topics Concern   Not on file  Social History Narrative   Not on file   Social Determinants of Health   Financial Resource Strain: Low Risk  (11/05/2022)   Overall Financial Resource Strain (CARDIA)    Difficulty of Paying Living Expenses: Not hard at all  Food Insecurity: No Food Insecurity (11/05/2022)   Hunger Vital Sign    Worried About Running Out of Food in the Last Year: Never true    Ran Out of Food in the Last Year: Never true  Transportation Needs: No Transportation Needs (11/05/2022)   PRAPARE - Administrator, Civil Service (Medical): No    Lack of Transportation (Non-Medical): No  Physical Activity: Insufficiently Active (11/05/2022)   Exercise Vital Sign    Days of Exercise per Week: 3 days    Minutes of Exercise per Session: 10 min  Stress: No Stress Concern Present (11/05/2022)   [employer] of Occupational Health - Occupational Stress Questionnaire    Feeling of Stress : Not at all  Social Connections: Socially Integrated (11/05/2022)   Social Connection and Isolation Panel [NHANES]    Frequency of Communication with Friends and Family: More than three times a week    Frequency of Social Gatherings with Friends and Family: More than three times a week    Attends Religious Services: More than 4 times per year    Active Member of [employer] or Organizations: Yes    Attends Museum/gallery exhibitions officer: More than 4 times per year    Marital Status: Married    Tobacco Counseling Counseling given: Not Answered   Clinical Intake:  Pre-visit preparation completed: Yes  Pain : No/denies pain     BMI - recorded: 29.15 Nutritional Status: BMI 25 -29 Overweight Nutritional Risks: None Diabetes: Yes CBG done?: No Did pt. bring in CBG monitor from home?: No  How often do you need to have someone help you when you read instructions, pamphlets, or other written materials from your doctor or pharmacy?: 1 - Never  Diabetic? Yes  Interpreter Needed?: No  Information entered by :: Theresa Mulligan LPNNutrition Risk Assessment:  Has the patient had any N/V/D within the last 2 months?  No  Does the patient have any non-healing wounds?  No  Has the patient had any unintentional weight loss or weight gain?  No   Diabetes:  Is the patient diabetic?  Yes  If diabetic, was a CBG obtained today?  No  Did the patient bring in their glucometer from home?  No  How often do you monitor your CBG's? PRN.   Financial Strains and Diabetes Management:  Are you having any financial strains with the device, your supplies or your medication? No .  Does the patient want to be seen by Chronic Care Management for management of their diabetes?  No  Would the patient like to be referred to a Nutritionist or for Diabetic Management?  No   Diabetic Exams:  Diabetic Eye Exam: Completed . Overdue for diabetic eye exam. Pt has been advised about the importance in completing this exam. A referral has been placed today. Message sent to referral coordinator for scheduling purposes. Advised pt to expect a call from office referred to regarding appt.  Diabetic Foot Exam: Completed . Pt has been advised about the importance in completing this exam. Pt is scheduled for diabetic foot exam on Followed by PCP.     Activities of Daily Living    11/05/2022   12:41 PM 11/02/2022    9:46 AM  In  your present state of health, do you have any difficulty performing the following activities:  Hearing? 0 0  Vision? 0 0  Difficulty concentrating or making decisions? 0 0  Walking or climbing stairs? 0 0  Dressing or bathing? 0 0  Doing errands, shopping? 0 0  Preparing Food and eating ? N N  Using the Toilet? N N  In the past six months, have you accidently leaked urine? N N  Do you have problems with loss of bowel control? N N  Managing your Medications? N N  Managing your Finances? N N  Housekeeping or managing your Housekeeping? N N    Patient Care Team: Kristian Covey, MD as PCP - [facility] [place], [place] Ropes, [facility] (Inactive) as Pharmacist (Pharmacist)  Indicate any recent Medical Services you may have received from other than [facility] providers in the past year (date may be approximate).     Assessment:   This is a routine wellness examination for [place].  Hearing/Vision screen Hearing Screening - Comments:: Denies hearing difficulties   Vision Screening - Comments:: Wears rx glasses - up to date with routine eye exams with  Dr Lucretia Roers  Dietary issues and exercise activities discussed: Exercise limited by: None identified   Goals Addressed               This Visit's Progress     No current goals (pt-stated)         Depression Screen    11/05/2022   12:40 PM 08/13/2022    8:10 AM 11/03/2021    3:55 PM 08/11/2021   10:36 AM 10/28/2020    3:42 PM 05/21/2020    7:02 AM 12/25/2019   10:42 AM  PHQ 2/9 Scores  PHQ - 2 Score 0 0 0 0 0 0 0  PHQ- 9 Score    0   0    Fall Risk    11/05/2022   12:41 PM 11/02/2022    9:46 AM 08/13/2022    8:10 AM 11/17/2021  9:11 AM 11/03/2021    3:58 PM  Fall Risk   Falls in the past year? 1 1 0 0 0  Number falls in past yr: 0 0 0  0  Injury with Fall? 0 0 0  0  Risk for fall due to : No Fall Risks  No Fall Risks  No Fall Risks  Follow up Falls prevention discussed  Falls evaluation completed      FALL RISK PREVENTION PERTAINING TO  THE HOME:  Any stairs in or around the home? Yes  If so, are there any without handrails? No  Home free of loose throw rugs in walkways, pet beds, electrical cords, etc? Yes  Adequate lighting in your home to reduce risk of falls? Yes   ASSISTIVE DEVICES UTILIZED TO PREVENT FALLS:  Life alert? No  Use of a cane, walker or w/c? No  Grab bars in the bathroom? No  Shower chair or bench in shower? Yes Elevated toilet seat or a handicapped toilet? Yes  TIMED UP AND GO:  Was the test performed? No . Audio Visit   Cognitive Function:        11/05/2022   12:42 PM 11/03/2021    3:59 PM  6CIT Screen  What Year? 0 points 0 points  What month? 0 points 0 points  What time? 0 points 0 points  Count back from 20 0 points 0 points  Months in reverse 0 points 0 points  Repeat phrase 0 points 0 points  Total Score 0 points 0 points    Immunizations Immunization History  Administered Date(s) Administered   Fluad Quad(high Dose 65+) 05/09/2019, 05/21/2020, 05/19/2022   Influenza Split 05/04/2011, 04/13/2014   Influenza, High Dose Seasonal PF 05/12/2017, 05/05/2018   Influenza-Unspecified 05/01/2015, 04/24/2016, 05/27/2021   Moderna Sars-Covid-2 Vaccination 11/28/2019, 12/26/2019   Pneumococcal Conjugate (Pcv15) 05/19/2022   Pneumococcal Conjugate-13 02/23/2016   Pneumococcal Polysaccharide-23 01/17/2012, 03/03/2018   RSV,unspecified 05/19/2022   Td 02/04/2006   Tdap 01/17/2012   Zoster Recombinat (Shingrix) 03/26/2017, 07/05/2017   Zoster, Live 01/17/2012    TDAP status: Due, Education has been provided regarding the importance of this vaccine. Advised may receive this vaccine at local pharmacy or Health Dept. Aware to provide a copy of the vaccination record if obtained from local pharmacy or Health Dept. Verbalized acceptance and understanding.  Flu Vaccine status: Up to date  Pneumococcal vaccine status: Up to date  Covid-19 vaccine status: Completed vaccines  Qualifies  for Shingles Vaccine? Yes   Zostavax completed Yes   Shingrix Completed?: Yes  Screening Tests Health Maintenance  Topic Date Due   DTaP/Tdap/Td (3 - Td or Tdap) 01/16/2022   FOOT EXAM  08/11/2022   COVID-19 Vaccine (3 - Moderna risk series) 11/21/2022 (Originally 01/23/2020)   HEMOGLOBIN A1C  02/11/2023   INFLUENZA VACCINE  02/17/2023   Diabetic kidney evaluation - eGFR measurement  08/14/2023   Diabetic kidney evaluation - Urine ACR  08/14/2023   OPHTHALMOLOGY EXAM  09/30/2023   Medicare Annual Wellness (AWV)  11/05/2023   COLONOSCOPY (Pts 45-80yrs Insurance coverage will need to be confirmed)  04/17/2029   Pneumonia Vaccine 61+ Years old  Completed   Hepatitis C Screening  Completed   Zoster Vaccines- Shingrix  Completed   HPV VACCINES  Aged Out    Health Maintenance  Health Maintenance Due  Topic Date Due   DTaP/Tdap/Td (3 - Td or Tdap) 01/16/2022   FOOT EXAM  08/11/2022    Colorectal cancer screening: Type of screening:  Colonoscopy. Completed 12/16/18. Repeat every 10 years  Lung Cancer Screening: (Low Dose CT Chest recommended if Age 71-80 years, 30 pack-year currently smoking OR have quit w/in 15years.) does not qualify.     Additional Screening:  Hepatitis C Screening: does qualify; Completed 02/23/16  Vision Screening: Recommended annual ophthalmology exams for early detection of glaucoma and other disorders of the eye. Is the patient up to date with their annual eye exam?  Yes  Who is the provider or what is the name of the office in which the patient attends annual eye exams? Dr Lucretia Roers If pt is not established with a provider, would they like to be referred to a provider to establish care? No .   Dental Screening: Recommended annual dental exams for proper oral hygiene  Community Resource Referral / Chronic Care Management:  CRR required this visit?  No   CCM required this visit?  No      Plan:     I have personally reviewed and noted the following in  the patient's chart:   Medical and social history Use of alcohol, tobacco or illicit drugs  Current medications and supplements including opioid prescriptions. Patient is not currently taking opioid prescriptions. Functional ability and status Nutritional status Physical activity Advanced directives List of other physicians Hospitalizations, surgeries, and ER visits in previous 12 months Vitals Screenings to include cognitive, depression, and falls Referrals and appointments  In addition, I have reviewed and discussed with patient certain preventive protocols, quality metrics, and best practice recommendations. A written personalized care plan for preventive services as well as general preventive health recommendations were provided to patient.     Tillie Rung, LPN   02/26/9146   Nurse Notes: None

## 2022-11-05 NOTE — Patient Instructions (Addendum)
Mr. Jared Byrd , Thank you for taking time to come for your Medicare Wellness Visit. I appreciate your ongoing commitment to your health goals. Please review the following plan we discussed and let me know if I can assist you in the future.   These are the goals we discussed:  Goals       Chronic Care Management      CARE PLAN ENTRY  Current Barriers:  Chronic Disease Management support, education, and care coordination needs related to Hypertension, Hyperlipidemia, and Diabetes   Hypertension Pharmacist Clinical Goal(s): Over the next 90 days, patient will work with PharmD and providers to maintain BP goal <140/90 Current regimen:  HCTZ 25 mg 0.5 tablet once daily Lisinopril 40 mg 1 tablet once daily Metoprolol succinate 50 mg 1 tablet once daily with or immediately following a meal Interventions: Discussed the importance of routine blood pressure monitoring Discussed the benefits of heart healthy diet and low salt intake Patient self care activities - Over the next 90 days, patient will: Check BP once a week, document, and provide at future appointments Ensure daily salt intake < 2300 mg/day  Hyperlipidemia Pharmacist Clinical Goal(s): Over the next 90 days, patient will work with PharmD and providers to maintain LDL goal < 100 Current regimen:  Atorvastatin 10 mg 1 tablet once daily Interventions: Advised the patient to take statin at bedtime optimal results Patient self care activities - Over the next 90 days, patient will: Take atorvastatin 10 mg daily at bedtime  Diabetes Pharmacist Clinical Goal(s): Over the next 90 days, patient will work with PharmD and providers to maintain A1c goal <7% Current regimen:  Trulicity 0.75 mg/0.5 ml inject 0.75 mg into the skin once a week Jardiance 10 mg 1 tablet once daily  Metformin 500 mg 2 tablets twice a day with meal Interventions: Discussed the benefits of lowering A1c further when it comes to cost savings Advised to check  fasting blood sugar every morning Patient self care activities - Over the next 90 days, patient will: Check blood sugar in the morning before eating or drinking, document, and provide at future appointments Contact provider with any episodes of hypoglycemia Continue to eat portion size meals and cut down on sugar and carbohydrates  Medication management Pharmacist Clinical Goal(s): Over the next 90 days, patient will work with PharmD and providers to maintain optimal medication adherence Current pharmacy: [facility] Comprehensive medication review performed. Continue current medication management strategy Patient self care activities - Over the next 90 days, patient will: Take medications as prescribed Report any questions or concerns to PharmD and/or provider(s)  Initial goal documentation       DIET - INCREASE WATER INTAKE      No current goals (pt-stated)      Weight (lb) < 200 lb (90.7 kg) (pt-stated)        This is a list of the screening recommended for you and due dates:  Health Maintenance  Topic Date Due   DTaP/Tdap/Td vaccine (3 - Td or Tdap) 01/16/2022   Complete foot exam   08/11/2022   COVID-19 Vaccine (3 - Moderna risk series) 11/21/2022*   Hemoglobin A1C  02/11/2023   Flu Shot  02/17/2023   Yearly kidney function blood test for diabetes  08/14/2023   Yearly kidney health urinalysis for diabetes  08/14/2023   Eye exam for diabetics  09/30/2023   Medicare Annual Wellness Visit  11/05/2023   Colon Cancer Screening  04/17/2029   Pneumonia Vaccine  Completed  Hepatitis C Screening: USPSTF Recommendation to screen - Ages 69-79 yo.  Completed   Zoster (Shingles) Vaccine  Completed   HPV Vaccine  Aged Out  *Topic was postponed. The date shown is not the original due date.    Advanced directives: Please bring a copy of your health care power of attorney and living will to the office to be added to your chart at your convenience.    Conditions/risks identified: None  Next appointment: Follow up in one year for your annual wellness visit.   Preventive Care 2 Years and Older, Male  Preventive care refers to lifestyle choices and visits with your health care provider that can promote health and wellness. What does preventive care include? A yearly physical exam. This is also called an annual well check. Dental exams once or twice a year. Routine eye exams. Ask your health care provider how often you should have your eyes checked. Personal lifestyle choices, including: Daily care of your teeth and gums. Regular physical activity. Eating a healthy diet. Avoiding tobacco and drug use. Limiting alcohol use. Practicing safe sex. Taking low doses of aspirin every day. Taking vitamin and mineral supplements as recommended by your health care provider. What happens during an annual well check? The services and screenings done by your health care provider during your annual well check will depend on your age, overall health, lifestyle risk factors, and family history of disease. Counseling  Your health care provider may ask you questions about your: Alcohol use. Tobacco use. Drug use. Emotional well-being. Home and relationship well-being. Sexual activity. Eating habits. History of falls. Memory and ability to understand (cognition). Work and work Astronomer. Screening  You may have the following tests or measurements: Height, weight, and BMI. Blood pressure. Lipid and cholesterol levels. These may be checked every 5 years, or more frequently if you are over 32 years old. Skin check. Lung cancer screening. You may have this screening every year starting at age 25 if you have a 30-pack-year history of smoking and currently smoke or have quit within the past 15 years. Fecal occult blood test (FOBT) of the stool. You may have this test every year starting at age 58. Flexible sigmoidoscopy or colonoscopy. You may  have a sigmoidoscopy every 5 years or a colonoscopy every 10 years starting at age 48. Prostate cancer screening. Recommendations will vary depending on your family history and other risks. Hepatitis C blood test. Hepatitis B blood test. Sexually transmitted disease (STD) testing. Diabetes screening. This is done by checking your blood sugar (glucose) after you have not eaten for a while (fasting). You may have this done every 1-3 years. Abdominal aortic aneurysm (AAA) screening. You may need this if you are a current or former smoker. Osteoporosis. You may be screened starting at age 71 if you are at high risk. Talk with your health care provider about your test results, treatment options, and if necessary, the need for more tests. Vaccines  Your health care provider may recommend certain vaccines, such as: Influenza vaccine. This is recommended every year. Tetanus, diphtheria, and acellular pertussis (Tdap, Td) vaccine. You may need a Td booster every 10 years. Zoster vaccine. You may need this after age 35. Pneumococcal 13-valent conjugate (PCV13) vaccine. One dose is recommended after age 31. Pneumococcal polysaccharide (PPSV23) vaccine. One dose is recommended after age 85. Talk to your health care provider about which screenings and vaccines you need and how often you need them. This information is not intended to replace  advice given to you by your health care provider. Make sure you discuss any questions you have with your health care provider. Document Released: 08/01/2015 Document Revised: 03/24/2016 Document Reviewed: 05/06/2015 Elsevier Interactive Patient Education  2017 [employer].  Fall Prevention in the Home Falls can cause injuries. They can happen to people of all ages. There are many things you can do to make your home safe and to help prevent falls. What can I do on the outside of my home? Regularly fix the edges of walkways and driveways and fix any cracks. Remove  anything that might make you trip as you walk through a door, such as a raised step or threshold. Trim any bushes or trees on the path to your home. Use bright outdoor lighting. Clear any walking paths of anything that might make someone trip, such as rocks or tools. Regularly check to see if handrails are loose or broken. Make sure that both sides of any steps have handrails. Any raised decks and porches should have guardrails on the edges. Have any leaves, snow, or ice cleared regularly. Use sand or salt on walking paths during winter. Clean up any spills in your garage right away. This includes oil or grease spills. What can I do in the bathroom? Use night lights. Install grab bars by the toilet and in the tub and shower. Do not use towel bars as grab bars. Use non-skid mats or decals in the tub or shower. If you need to sit down in the shower, use a plastic, non-slip stool. Keep the floor dry. Clean up any water that spills on the floor as soon as it happens. Remove soap buildup in the tub or shower regularly. Attach bath mats securely with double-sided non-slip rug tape. Do not have throw rugs and other things on the floor that can make you trip. What can I do in the bedroom? Use night lights. Make sure that you have a light by your bed that is easy to reach. Do not use any sheets or blankets that are too big for your bed. They should not hang down onto the floor. Have a firm chair that has side arms. You can use this for support while you get dressed. Do not have throw rugs and other things on the floor that can make you trip. What can I do in the kitchen? Clean up any spills right away. Avoid walking on wet floors. Keep items that you use a lot in easy-to-reach places. If you need to reach something above you, use a strong step stool that has a grab bar. Keep electrical cords out of the way. Do not use floor polish or wax that makes floors slippery. If you must use wax, use  non-skid floor wax. Do not have throw rugs and other things on the floor that can make you trip. What can I do with my stairs? Do not leave any items on the stairs. Make sure that there are handrails on both sides of the stairs and use them. Fix handrails that are broken or loose. Make sure that handrails are as long as the stairways. Check any carpeting to make sure that it is firmly attached to the stairs. Fix any carpet that is loose or worn. Avoid having throw rugs at the top or bottom of the stairs. If you do have throw rugs, attach them to the floor with carpet tape. Make sure that you have a light switch at the top of the stairs and the bottom  of the stairs. If you do not have them, ask someone to add them for you. What else can I do to help prevent falls? Wear shoes that: Do not have high heels. Have rubber bottoms. Are comfortable and fit you well. Are closed at the toe. Do not wear sandals. If you use a stepladder: Make sure that it is fully opened. Do not climb a closed stepladder. Make sure that both sides of the stepladder are locked into place. Ask someone to hold it for you, if possible. Clearly mark and make sure that you can see: Any grab bars or handrails. First and last steps. Where the edge of each step is. Use tools that help you move around (mobility aids) if they are needed. These include: Canes. Walkers. Scooters. Crutches. Turn on the lights when you go into a dark area. Replace any light bulbs as soon as they burn out. Set up your furniture so you have a clear path. Avoid moving your furniture around. If any of your floors are uneven, fix them. If there are any pets around you, be aware of where they are. Review your medicines with your doctor. Some medicines can make you feel dizzy. This can increase your chance of falling. Ask your doctor what other things that you can do to help prevent falls. This information is not intended to replace advice given to  you by your health care provider. Make sure you discuss any questions you have with your health care provider. Document Released: 05/01/2009 Document Revised: 12/11/2015 Document Reviewed: 08/09/2014 Elsevier Interactive Patient Education  2017 [employer].

## 2023-03-31 ENCOUNTER — Other Ambulatory Visit: Payer: Self-pay | Admitting: Family Medicine

## 2023-03-31 DIAGNOSIS — Z0000 Encounter for general adult medical examination without abnormal findings: Secondary | ICD-10-CM

## 2023-06-17 ENCOUNTER — Other Ambulatory Visit: Payer: Self-pay | Admitting: Family Medicine

## 2023-06-17 DIAGNOSIS — Z0000 Encounter for general adult medical examination without abnormal findings: Secondary | ICD-10-CM

## 2023-07-15 ENCOUNTER — Other Ambulatory Visit: Payer: Self-pay | Admitting: Family Medicine

## 2023-07-15 DIAGNOSIS — E1140 Type 2 diabetes mellitus with diabetic neuropathy, unspecified: Secondary | ICD-10-CM

## 2023-07-20 ENCOUNTER — Other Ambulatory Visit: Payer: Self-pay | Admitting: Family Medicine

## 2023-08-15 ENCOUNTER — Ambulatory Visit (INDEPENDENT_AMBULATORY_CARE_PROVIDER_SITE_OTHER): Payer: Medicare HMO | Admitting: Family Medicine

## 2023-08-15 ENCOUNTER — Encounter: Payer: Self-pay | Admitting: Family Medicine

## 2023-08-15 VITALS — BP 134/70 | HR 78 | Temp 98.1°F | Ht 73.23 in | Wt 227.4 lb

## 2023-08-15 DIAGNOSIS — I10 Essential (primary) hypertension: Secondary | ICD-10-CM | POA: Diagnosis not present

## 2023-08-15 DIAGNOSIS — E8881 Metabolic syndrome: Secondary | ICD-10-CM | POA: Diagnosis not present

## 2023-08-15 DIAGNOSIS — Z0000 Encounter for general adult medical examination without abnormal findings: Secondary | ICD-10-CM | POA: Diagnosis not present

## 2023-08-15 DIAGNOSIS — E1140 Type 2 diabetes mellitus with diabetic neuropathy, unspecified: Secondary | ICD-10-CM

## 2023-08-15 DIAGNOSIS — E785 Hyperlipidemia, unspecified: Secondary | ICD-10-CM

## 2023-08-15 LAB — CBC WITH DIFFERENTIAL/PLATELET
Basophils Absolute: 0 10*3/uL (ref 0.0–0.1)
Basophils Relative: 0.2 % (ref 0.0–3.0)
Eosinophils Absolute: 0.2 10*3/uL (ref 0.0–0.7)
Eosinophils Relative: 3.4 % (ref 0.0–5.0)
HCT: 46 % (ref 39.0–52.0)
Hemoglobin: 15.4 g/dL (ref 13.0–17.0)
Lymphocytes Relative: 28.2 % (ref 12.0–46.0)
Lymphs Abs: 2 10*3/uL (ref 0.7–4.0)
MCHC: 33.4 g/dL (ref 30.0–36.0)
MCV: 92.3 fL (ref 78.0–100.0)
Monocytes Absolute: 0.5 10*3/uL (ref 0.1–1.0)
Monocytes Relative: 7.4 % (ref 3.0–12.0)
Neutro Abs: 4.3 10*3/uL (ref 1.4–7.7)
Neutrophils Relative %: 60.8 % (ref 43.0–77.0)
Platelets: 270 10*3/uL (ref 150.0–400.0)
RBC: 4.98 Mil/uL (ref 4.22–5.81)
RDW: 13.3 % (ref 11.5–15.5)
WBC: 7 10*3/uL (ref 4.0–10.5)

## 2023-08-15 LAB — BASIC METABOLIC PANEL
BUN: 23 mg/dL (ref 6–23)
CO2: 28 meq/L (ref 19–32)
Calcium: 10 mg/dL (ref 8.4–10.5)
Chloride: 100 meq/L (ref 96–112)
Creatinine, Ser: 1.12 mg/dL (ref 0.40–1.50)
GFR: 65.44 mL/min (ref >60.00)
Glucose, Bld: 155 mg/dL — ABNORMAL HIGH (ref 70–99)
Potassium: 4.7 meq/L (ref 3.5–5.1)
Sodium: 136 meq/L (ref 135–145)

## 2023-08-15 LAB — HEPATIC FUNCTION PANEL
ALT: 22 U/L (ref 0–53)
AST: 22 U/L (ref 0–37)
Albumin: 4.9 g/dL (ref 3.5–5.2)
Alkaline Phosphatase: 61 U/L (ref 39–117)
Bilirubin, Direct: 0.2 mg/dL (ref 0.0–0.3)
Total Bilirubin: 1.4 mg/dL — ABNORMAL HIGH (ref 0.2–1.2)
Total Protein: 7.1 g/dL (ref 6.0–8.3)

## 2023-08-15 LAB — LIPID PANEL
Cholesterol: 147 mg/dL (ref 0–200)
HDL: 43.1 mg/dL (ref >39.00)
LDL Cholesterol: 63 mg/dL (ref 0–99)
NonHDL: 104.25
Total CHOL/HDL Ratio: 3
Triglycerides: 205 mg/dL — ABNORMAL HIGH (ref 0.0–149.0)
VLDL: 41 mg/dL — ABNORMAL HIGH (ref 0.0–40.0)

## 2023-08-15 LAB — MICROALBUMIN / CREATININE URINE RATIO
Creatinine,U: 62 mg/dL
Microalb Creat Ratio: 1.2 mg/g (ref 0.0–30.0)
Microalb, Ur: 0.7 mg/dL (ref 0.0–1.9)

## 2023-08-15 LAB — HEMOGLOBIN A1C: Hgb A1c MFr Bld: 9.3 % — ABNORMAL HIGH (ref 4.6–6.5)

## 2023-08-15 NOTE — Progress Notes (Signed)
Established Patient Office Visit  Subjective   Patient ID: Jared Byrd, male    DOB: 19-Nov-1950  Age: 73 y.o. MRN: [id number]  Chief Complaint  Patient presents with   Annual Exam    HPI   Jared Byrd is seen today for physical exam.  Past medical history is reviewed.  He has history of hypertension, type 2 diabetes, dyslipidemia.  Generally doing well.  No specific complaints today.  Compliant with medications.  His current regular medications include Trulicity, HCTZ, atorvastatin, Jardiance, metoprolol, lisinopril, and metformin.  Last A1c 7.3%.  Denies any chest pains or other specific complaints today.  Health maintenance reviewed:  Health Maintenance  Topic Date Due   DTaP/Tdap/Td (3 - Td or Tdap) 01/16/2022   FOOT EXAM  08/11/2022   HEMOGLOBIN A1C  02/11/2023   COVID-19 Vaccine (3 - 2024-25 season) 03/20/2023   Diabetic kidney evaluation - eGFR measurement  08/14/2023   Diabetic kidney evaluation - Urine ACR  08/14/2023   OPHTHALMOLOGY EXAM  09/30/2023   Medicare Annual Wellness (AWV)  11/05/2023   Colonoscopy  04/17/2029   Pneumonia Vaccine 63+ Years old  Completed   INFLUENZA VACCINE  Completed   Hepatitis C Screening  Completed   Zoster Vaccines- Shingrix  Completed   HPV VACCINES  Aged Out    Family history-mother had history of breast cancer and type 2 diabetes.  He has a brother with type 2 diabetes.  Father with history of CABG early 91s.  Sister with history of breast cancer and another sister with history of colon cancer.   Social history-he is married.  Smoked only briefly years ago.  No alcohol.  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 His smoking use included cigarettes. He started smoking about 52 years ago. He has a 5 pack-year smoking history. He has never used smokeless tobacco. He reports that he does not currently use alcohol. No history on file for drug use. family history includes Cancer in his mother and another family member; Cancer (age of onset: 69) in his sister; Diabetes in his mother; Heart disease in his father, mother, and another family member; Hyperlipidemia in an other family member; Hypertension in his father and another family member; Multiple endocrine neoplasia in his father. No Known Allergies  Review of Systems  Constitutional:  Negative for malaise/fatigue and weight loss.  Eyes:  Negative for blurred vision.  Respiratory:  Negative for shortness of breath.   Cardiovascular:  Negative for chest pain.  Gastrointestinal:  Negative for abdominal pain.  Genitourinary:  Negative for dysuria.  Neurological:  Negative for dizziness, weakness and headaches.      Objective:     BP 134/70 (BP Location: Left Arm, Patient Position: Sitting, Cuff Size: Normal)   Pulse 78   Temp 98.1 F (36.7 C) (Oral)   Ht 6' 1.23" (1.86 m)   Wt 227 lb 6.4 oz (103.1 kg)   SpO2 97%   BMI 29.82 kg/m  BP Readings from Last 3 Encounters:  08/15/23 134/70  08/13/22 (!) 144/70  11/18/21 116/64   Wt Readings from  Last 3 Encounters:  08/15/23 227 lb 6.4 oz (103.1 kg)  11/05/22 220 lb (99.8 kg)  08/13/22 222 lb 6.4 oz (100.9 kg)      Physical Exam Vitals reviewed.  Constitutional:      General: He is not in acute distress.    Appearance: He is well-developed.  HENT:     Head: Normocephalic and atraumatic.     Right Ear: External ear normal.     Left Ear: External ear normal.  Eyes:     Conjunctiva/sclera: Conjunctivae normal.     Pupils: Pupils are equal, round, and reactive to light.  Neck:     Thyroid: No thyromegaly.   Cardiovascular:     Rate and Rhythm: Normal rate and regular rhythm.     Heart sounds: Normal heart sounds. No murmur heard. Pulmonary:     Effort: No respiratory distress.     Breath sounds: No wheezing or rales.  Abdominal:     General: Bowel sounds are normal. There is no distension.     Palpations: Abdomen is soft. There is no mass.     Tenderness: There is no abdominal tenderness. There is no guarding or rebound.  Musculoskeletal:     Cervical back: Normal range of motion and neck supple.  Lymphadenopathy:     Cervical: No cervical adenopathy.  Skin:    Findings: No rash.     Comments: No significant calluses.  Does have some impairment with monofilament testing great toe volar surface bilaterally.  No ulcerations  Neurological:     Mental Status: He is alert and oriented to person, place, and time.     Cranial Nerves: No cranial nerve deficit.      No results found for any visits on 08/15/23.    The ASCVD Risk score (Arnett DK, et al., 2019) failed to calculate for the following reasons:   The valid total cholesterol range is 130 to 320 mg/dL    Assessment & Plan:   #1 physical exam.  Chronic medical problems as above.  We discussed the following health maintenance items  -Flu vaccine already given -Pneumonia vaccines complete -Shingrix complete -Colonoscopy up-to-date -Does need tetanus.  Consider at pharmacy. -RSV vaccine already given  #2 type 2 diabetes.  History of marginal control.  Recheck A1c today along with urine microalbumin screen.  Continue yearly diabetic eye exam.  He is reluctant to consider additional medications.  If A1c not to goal he would like to give this 3 months of lifestyle modification  #3 hyperlipidemia treated with atorvastatin.  Recheck lipid and hepatic panel today  #4 hypertension stable on lisinopril and HCTZ along with metoprolol.  Continue current regimen.   No follow-ups on file.    Evelena Peat, MD

## 2023-08-17 ENCOUNTER — Other Ambulatory Visit: Payer: Self-pay | Admitting: Family Medicine

## 2023-08-17 DIAGNOSIS — E1140 Type 2 diabetes mellitus with diabetic neuropathy, unspecified: Secondary | ICD-10-CM

## 2023-08-19 NOTE — Progress Notes (Signed)
 Noted.  Kristian Covey MD [facility]

## 2023-08-25 ENCOUNTER — Other Ambulatory Visit: Payer: Self-pay | Admitting: Family Medicine

## 2023-08-25 DIAGNOSIS — E785 Hyperlipidemia, unspecified: Secondary | ICD-10-CM

## 2023-08-31 ENCOUNTER — Telehealth: Payer: Self-pay

## 2023-08-31 ENCOUNTER — Other Ambulatory Visit: Payer: Self-pay | Admitting: Family Medicine

## 2023-08-31 DIAGNOSIS — Z0000 Encounter for general adult medical examination without abnormal findings: Secondary | ICD-10-CM

## 2023-08-31 DIAGNOSIS — E785 Hyperlipidemia, unspecified: Secondary | ICD-10-CM

## 2023-08-31 MED ORDER — TRULICITY 1.5 MG/0.5ML ~~LOC~~ SOAJ: 1.5000 mg | SUBCUTANEOUS | 3 refills | Status: DC

## 2023-08-31 NOTE — Telephone Encounter (Signed)
Copied from CRM [id number]. Topic: Clinical - Prescription Issue >> Aug 31, 2023 10:03 AM Mackie Pai E wrote: Reason for CRM: Patient called in saying that his Dulaglutide (TRULICITY) 1.5 MG/0.5ML SOPN was denied for refill. Patient states he has 2 weeks left of this medication, but he just wanted some clarification as to why it was denied. Callback number for patient is [phone].

## 2023-08-31 NOTE — Telephone Encounter (Signed)
Rx refilled.

## 2023-08-31 NOTE — Addendum Note (Signed)
Addended by: Christy Sartorius on: 08/31/2023 10:19 AM   Modules accepted: Orders

## 2023-09-06 ENCOUNTER — Telehealth: Payer: Self-pay

## 2023-09-06 NOTE — Telephone Encounter (Signed)
Called patient left a VM medication has been sent to pharmacy 2/12

## 2023-09-06 NOTE — Telephone Encounter (Signed)
Copied from CRM [id number]. Topic: Clinical - Medication Question >> Sep 06, 2023  9:56 AM Jared Byrd wrote: Reason for CRM: pt called and wanted to know why his refill was not appropriate for Trulicity.   Additionally, he wants to have his A1c checked and scheduled an appt for 11/17/23. Please call and advise.

## 2023-09-09 ENCOUNTER — Other Ambulatory Visit: Payer: Self-pay

## 2023-09-09 DIAGNOSIS — E785 Hyperlipidemia, unspecified: Secondary | ICD-10-CM

## 2023-09-09 MED ORDER — TRULICITY 1.5 MG/0.5ML ~~LOC~~ SOAJ: 1.5000 mg | SUBCUTANEOUS | 3 refills | Status: DC

## 2023-09-16 ENCOUNTER — Other Ambulatory Visit: Payer: Self-pay | Admitting: Family Medicine

## 2023-10-05 ENCOUNTER — Other Ambulatory Visit: Payer: Self-pay | Admitting: Family Medicine

## 2023-10-05 DIAGNOSIS — Z0000 Encounter for general adult medical examination without abnormal findings: Secondary | ICD-10-CM

## 2023-11-11 ENCOUNTER — Ambulatory Visit: Payer: Medicare HMO

## 2023-11-11 VITALS — Ht 73.0 in | Wt 215.0 lb

## 2023-11-11 DIAGNOSIS — Z0000 Encounter for general adult medical examination without abnormal findings: Secondary | ICD-10-CM

## 2023-11-11 NOTE — Progress Notes (Signed)
 Subjective:   Jared Byrd is a 73 y.o. who presents for a Medicare Wellness preventive visit.  Visit Complete: Virtual I connected with  Jared Byrd on 11/11/23 by a audio enabled telemedicine application and verified that I am speaking with the correct person using two identifiers.  Patient Location: Home  Provider Location: Home Office  I discussed the limitations of evaluation and management by telemedicine. The patient expressed understanding and agreed to proceed.  Vital Signs: Because this visit was a virtual/telehealth visit, some criteria may be missing or patient reported. Any vitals not documented were not able to be obtained and vitals that have been documented are patient reported.    Persons Participating in Visit: Patient.  AWV Questionnaire: No: Patient Medicare AWV questionnaire was not completed prior to this visit.  Cardiac Risk Factors include: advanced age (>35men, >54 women);male gender;diabetes mellitus;hypertension     Objective:    Today's Vitals   11/11/23 1533  Weight: 215 lb (97.5 kg)  Height: 6\' 1"  (1.854 m)   Body mass index is 28.37 kg/m.     11/11/2023    3:40 PM 11/05/2022   12:42 PM 11/03/2021    3:58 PM 10/28/2020    3:49 PM  Advanced Directives  Does Patient Have a Medical Advance Directive? Yes Yes Yes Yes  Type of Estate agent of [place];Living will Healthcare Power of [place];Living will Healthcare Power of [place];Living will Living will;Healthcare Power of Attorney  Does patient want to make changes to medical advance directive?   No - Patient declined No - Patient declined  Copy of Healthcare Power of Attorney in Chart? No - copy requested No - copy requested No - copy requested No - copy requested    Current Medications (verified) Outpatient Encounter Medications as of 11/11/2023  Medication Sig   Ascorbic Acid (VITAMIN C) 1000 MG tablet Take 1,000 mg by mouth daily.   atorvastatin  (LIPITOR) 20 MG  tablet TAKE 1 TABLET EVERY DAY   Blood Glucose Monitoring Suppl (ONETOUCH VERIO IQ SYSTEM) W/DEVICE KIT Use as instructed.   CO-ENZYME Q-10 PO Take 200 mg by mouth daily.    Dulaglutide  (TRULICITY ) 1.5 MG/0.5ML SOAJ Inject 1.5 mg into the skin once a week.   empagliflozin  (JARDIANCE ) 10 MG TABS tablet TAKE 1 TABLET EVERY DAY   hydrochlorothiazide  (HYDRODIURIL ) 25 MG tablet TAKE 1/2 TABLET EVERY DAY   indomethacin  (INDOCIN ) 50 MG capsule Take 1 capsule (50 mg total) by mouth 3 (three) times daily as needed.   lisinopril  (ZESTRIL ) 40 MG tablet TAKE 1 TABLET EVERY DAY   metFORMIN  (GLUCOPHAGE ) 500 MG tablet TAKE 2 TABLETS TWICE DAILY WITH MEALS   metoprolol  succinate (TOPROL -XL) 50 MG 24 hr tablet TAKE 1 TABLET BY MOUTH DAILY WITH OR IMMEDIATELY FOLLOWING A MEAL   Multiple Vitamin (MULTIVITAMIN) tablet Take 1 tablet by mouth daily. One A Day Mens   Facility-Administered Encounter Medications as of 11/11/2023  Medication   zoster vaccine live (PF) (ZOSTAVAX) injection 19,400 Units    Allergies (verified) Patient has no known allergies.   History: Past Medical History:  Diagnosis Date   COLONIC POLYPS, HX OF 02/03/2009   DIAB W/O MENTION COMP TYPE II/UNS TYPE UNCNTRL 02/03/2009   GERD 02/03/2009   HYPERTENSION 01/31/2009   METABOLIC SYNDROME X 02/03/2009   Past Surgical History:  Procedure Laterality Date   TONSILLECTOMY Bilateral    Family History  Problem Relation Age of Onset   Heart disease Mother        "valve"  disease   Cancer Mother        breast   Diabetes Mother    Hypertension Father    Heart disease Father    Multiple endocrine neoplasia Father    Cancer Sister 62       colon cancer   Hyperlipidemia Other    Hypertension Other    Heart disease Other    Cancer Other        parent   Social History   Socioeconomic History   Marital status: Married    Spouse name: Not on file   Number of children: Not on file   Years of education: Not on file   Highest education  level: Some college, no degree  Occupational History   Not on file  Tobacco Use   Smoking status: Former    Current packs/day: 0.00    Average packs/day: 1 pack/day for 5.0 years (5.0 ttl pk-yrs)    Types: Cigarettes    Start date: 01/13/1971    Quit date: 01/13/1976    Years since quitting: 47.8   Smokeless tobacco: Never  Vaping Use   Vaping status: Never Used  Substance and Sexual Activity   Alcohol use: Not Currently   Drug use: Not on file   Sexual activity: Not on file  Other Topics Concern   Not on file  Social History Narrative   Not on file   Social Drivers of Health   Financial Resource Strain: Low Risk  (11/11/2023)   Overall Financial Resource Strain (CARDIA)    Difficulty of Paying Living Expenses: Not hard at all  Food Insecurity: No Food Insecurity (11/11/2023)   Hunger Vital Sign    Worried About Running Out of Food in the Last Year: Never true    Ran Out of Food in the Last Year: Never true  Transportation Needs: No Transportation Needs (11/11/2023)   PRAPARE - Administrator, Civil Service (Medical): No    Lack of Transportation (Non-Medical): No  Physical Activity: Insufficiently Active (11/11/2023)   Exercise Vital Sign    Days of Exercise per Week: 1 day    Minutes of Exercise per Session: 20 min  Stress: No Stress Concern Present (11/11/2023)   [employer] of Occupational Health - Occupational Stress Questionnaire    Feeling of Stress : Not at all  Social Connections: Socially Integrated (11/11/2023)   Social Connection and Isolation Panel [NHANES]    Frequency of Communication with Friends and Family: More than three times a week    Frequency of Social Gatherings with Friends and Family: More than three times a week    Attends Religious Services: More than 4 times per year    Active Member of [employer] or Organizations: Yes    Attends Engineer, structural: More than 4 times per year    Marital Status: Married    Tobacco  Counseling Counseling given: Not Answered    Clinical Intake:  Pre-visit preparation completed: Yes  Pain : No/denies pain     BMI - recorded: 28.37 Nutritional Status: BMI 25 -29 Overweight Nutritional Risks: None Diabetes: Yes CBG done?: No Did pt. bring in CBG monitor from home?: No  Lab Results  Component Value Date   HGBA1C 9.3 (H) 08/15/2023   HGBA1C 7.3 (H) 08/13/2022   HGBA1C 7.9 (A) 11/18/2021     How often do you need to have someone help you when you read instructions, pamphlets, or other written materials from your doctor or pharmacy?: 1 -  Never  Interpreter Needed?: No  Information entered by :: Farris Hong LPN   Activities of Daily Living     11/11/2023    3:39 PM  In your present state of health, do you have any difficulty performing the following activities:  Hearing? 0  Vision? 0  Difficulty concentrating or making decisions? 0  Walking or climbing stairs? 0  Dressing or bathing? 0  Doing errands, shopping? 0  Preparing Food and eating ? N  Using the Toilet? N  In the past six months, have you accidently leaked urine? N  Do you have problems with loss of bowel control? N  Managing your Medications? N  Managing your Finances? N  Housekeeping or managing your Housekeeping? N    Patient Care Team: Marquetta Sit, MD as PCP - General Angelito, Myles Arvin, [facility] (Inactive) as Pharmacist (Pharmacist)  Indicate any recent Medical Services you may have received from other than [facility] providers in the past year (date may be approximate).     Assessment:   This is a routine wellness examination for [place].  Hearing/Vision screen Hearing Screening - Comments:: Denies hearing difficulties   Vision Screening - Comments:: Wears rx glasses - up to date with routine eye exams with  [facility]   Goals Addressed               This Visit's Progress     Increase physical activity (pt-stated)        Remain active.       Depression  Screen     11/11/2023    3:39 PM 11/05/2022   12:40 PM 08/13/2022    8:10 AM 11/03/2021    3:55 PM 08/11/2021   10:36 AM 10/28/2020    3:42 PM 05/21/2020    7:02 AM  PHQ 2/9 Scores  PHQ - 2 Score 0 0 0 0 0 0 0  PHQ- 9 Score     0      Fall Risk     11/11/2023    3:39 PM 11/05/2022   12:41 PM 11/02/2022    9:46 AM 08/13/2022    8:10 AM 11/17/2021    9:11 AM  Fall Risk   Falls in the past year? 0 1 1 0 0  Number falls in past yr: 0 0 0 0   Injury with Fall? 0 0 0 0   Risk for fall due to : No Fall Risks No Fall Risks  No Fall Risks   Follow up Falls prevention discussed;Falls evaluation completed Falls prevention discussed  Falls evaluation completed     MEDICARE RISK AT HOME:  Medicare Risk at Home Any stairs in or around the home?: Yes If so, are there any without handrails?: No Home free of loose throw rugs in walkways, pet beds, electrical cords, etc?: Yes Adequate lighting in your home to reduce risk of falls?: Yes Life alert?: No Use of a cane, walker or w/c?: No Grab bars in the bathroom?: No Shower chair or bench in shower?: Yes Elevated toilet seat or a handicapped toilet?: Yes  TIMED UP AND GO:  Was the test performed?  No  Cognitive Function: 6CIT completed        11/11/2023    3:40 PM 11/05/2022   12:42 PM 11/03/2021    3:59 PM  6CIT Screen  What Year? 0 points 0 points 0 points  What month? 0 points 0 points 0 points  What time? 0 points 0 points 0 points  Count back from 20 0 points 0 points 0 points  Months in reverse 0 points 0 points 0 points  Repeat phrase 0 points 0 points 0 points  Total Score 0 points 0 points 0 points    Immunizations Immunization History  Administered Date(s) Administered   Fluad Quad(high Dose 65+) 05/09/2019, 05/21/2020, 05/19/2022   Influenza Split 05/04/2011, 04/13/2014   Influenza, High Dose Seasonal PF 05/12/2017, 05/05/2018   Influenza-Unspecified 05/01/2015, 04/24/2016, 05/27/2021, 04/19/2023   Moderna Sars-Covid-2  Vaccination 11/28/2019, 12/26/2019   Pneumococcal Conjugate (Pcv15) 05/19/2022   Pneumococcal Conjugate-13 02/23/2016   Pneumococcal Polysaccharide-23 01/17/2012, 03/03/2018   RSV,unspecified 05/19/2022   Td 02/04/2006   Tdap 01/17/2012   Zoster Recombinant(Shingrix) 03/26/2017, 07/05/2017   Zoster, Live 01/17/2012    Screening Tests Health Maintenance  Topic Date Due   DTaP/Tdap/Td (3 - Td or Tdap) 01/16/2022   FOOT EXAM  08/11/2022   COVID-19 Vaccine (3 - 2024-25 season) 03/20/2023   OPHTHALMOLOGY EXAM  09/30/2023   HEMOGLOBIN A1C  02/12/2024   INFLUENZA VACCINE  02/17/2024   Diabetic kidney evaluation - eGFR measurement  08/14/2024   Diabetic kidney evaluation - Urine ACR  08/14/2024   Medicare Annual Wellness (AWV)  11/10/2024   Colonoscopy  04/17/2029   Pneumonia Vaccine 96+ Years old  Completed   Hepatitis C Screening  Completed   Zoster Vaccines- Shingrix  Completed   HPV VACCINES  Aged Out   Meningococcal B Vaccine  Aged Out    Health Maintenance  Health Maintenance Due  Topic Date Due   DTaP/Tdap/Td (3 - Td or Tdap) 01/16/2022   FOOT EXAM  08/11/2022   COVID-19 Vaccine (3 - 2024-25 season) 03/20/2023   OPHTHALMOLOGY EXAM  09/30/2023   Health Maintenance Items Addressed:    Additional Screening:  Vision Screening: Recommended annual ophthalmology exams for early detection of glaucoma and other disorders of the eye.  Dental Screening: Recommended annual dental exams for proper oral hygiene  Community Resource Referral / Chronic Care Management: CRR required this visit?  No   CCM required this visit?  No     Plan:     I have personally reviewed and noted the following in the patient's chart:   Medical and social history Use of alcohol, tobacco or illicit drugs  Current medications and supplements including opioid prescriptions. Patient is not currently taking opioid prescriptions. Functional ability and status Nutritional status Physical  activity Advanced directives List of other physicians Hospitalizations, surgeries, and ER visits in previous 12 months Vitals Screenings to include cognitive, depression, and falls Referrals and appointments  In addition, I have reviewed and discussed with patient certain preventive protocols, quality metrics, and best practice recommendations. A written personalized care plan for preventive services as well as general preventive health recommendations were provided to patient.     Dewayne Ford, LPN   6/96/2952   After Visit Summary: (MyChart) Due to this being a telephonic visit, the after visit summary with patients personalized plan was offered to patient via MyChart   Notes: Nothing significant to report at this time.

## 2023-11-11 NOTE — Patient Instructions (Addendum)
 Jared Byrd , Thank you for taking time to come for your Medicare Wellness Visit. I appreciate your ongoing commitment to your health goals. Please review the following plan we discussed and let me know if I can assist you in the future.   Referrals/Orders/Follow-Ups/Clinician Recommendations: Follow up with Eye Exam appointment.  This is a list of the screening recommended for you and due dates:  Health Maintenance  Topic Date Due   DTaP/Tdap/Td vaccine (3 - Td or Tdap) 01/16/2022   Complete foot exam   08/11/2022   COVID-19 Vaccine (3 - 2024-25 season) 03/20/2023   Eye exam for diabetics  09/30/2023   Hemoglobin A1C  02/12/2024   Flu Shot  02/17/2024   Yearly kidney function blood test for diabetes  08/14/2024   Yearly kidney health urinalysis for diabetes  08/14/2024   Medicare Annual Wellness Visit  11/10/2024   Colon Cancer Screening  04/17/2029   Pneumonia Vaccine  Completed   Hepatitis C Screening  Completed   Zoster (Shingles) Vaccine  Completed   HPV Vaccine  Aged Out   Meningitis B Vaccine  Aged Out    Advanced directives: (Copy Requested) Please bring a copy of your health care power of attorney and living will to the office to be added to your chart at your convenience. You can mail to [facility] [address] [place], [place] [address] or email to [email or web address]  Next Medicare Annual Wellness Visit scheduled for next year: Yes

## 2023-11-17 ENCOUNTER — Other Ambulatory Visit: Payer: Self-pay | Admitting: Family Medicine

## 2023-11-17 ENCOUNTER — Other Ambulatory Visit (INDEPENDENT_AMBULATORY_CARE_PROVIDER_SITE_OTHER): Payer: Medicare HMO

## 2023-11-17 DIAGNOSIS — E785 Hyperlipidemia, unspecified: Secondary | ICD-10-CM

## 2023-11-17 DIAGNOSIS — E1140 Type 2 diabetes mellitus with diabetic neuropathy, unspecified: Secondary | ICD-10-CM | POA: Diagnosis not present

## 2023-11-17 LAB — HEMOGLOBIN A1C: Hgb A1c MFr Bld: 7.7 % — ABNORMAL HIGH (ref 4.6–6.5)

## 2023-11-18 MED ORDER — TRULICITY 1.5 MG/0.5ML ~~LOC~~ SOAJ: 1.5000 mg | SUBCUTANEOUS | 3 refills | Status: DC

## 2023-11-18 NOTE — Addendum Note (Signed)
 Addended by: Aurelio Leer on: 11/18/2023 10:46 AM   Modules accepted: Orders

## 2023-11-23 DIAGNOSIS — H2513 Age-related nuclear cataract, bilateral: Secondary | ICD-10-CM | POA: Diagnosis not present

## 2023-11-23 DIAGNOSIS — I10 Essential (primary) hypertension: Secondary | ICD-10-CM | POA: Diagnosis not present

## 2023-11-23 DIAGNOSIS — H524 Presbyopia: Secondary | ICD-10-CM | POA: Diagnosis not present

## 2023-11-23 DIAGNOSIS — E113293 Type 2 diabetes mellitus with mild nonproliferative diabetic retinopathy without macular edema, bilateral: Secondary | ICD-10-CM | POA: Diagnosis not present

## 2023-11-23 DIAGNOSIS — H35033 Hypertensive retinopathy, bilateral: Secondary | ICD-10-CM | POA: Diagnosis not present

## 2023-11-23 DIAGNOSIS — Z0100 Encounter for examination of eyes and vision without abnormal findings: Secondary | ICD-10-CM | POA: Diagnosis not present

## 2023-11-23 DIAGNOSIS — H5203 Hypermetropia, bilateral: Secondary | ICD-10-CM | POA: Diagnosis not present

## 2024-02-21 ENCOUNTER — Other Ambulatory Visit: Payer: Self-pay | Admitting: Family Medicine

## 2024-02-21 DIAGNOSIS — Z0000 Encounter for general adult medical examination without abnormal findings: Secondary | ICD-10-CM

## 2024-02-29 ENCOUNTER — Other Ambulatory Visit: Payer: Self-pay | Admitting: Family Medicine

## 2024-03-26 ENCOUNTER — Telehealth: Payer: Self-pay | Admitting: *Deleted

## 2024-03-26 NOTE — Telephone Encounter (Signed)
 Appt scheduled

## 2024-03-26 NOTE — Telephone Encounter (Signed)
 Copied from CRM [id number]. Topic: Clinical - Request for Lab/Test Order >> Mar 26, 2024 10:37 AM Roselie BROCKS wrote: Reason for CRM: Patient requested a A1c appointment

## 2024-03-29 ENCOUNTER — Other Ambulatory Visit

## 2024-03-30 ENCOUNTER — Ambulatory Visit (INDEPENDENT_AMBULATORY_CARE_PROVIDER_SITE_OTHER): Admitting: Family Medicine

## 2024-03-30 VITALS — BP 110/60 | HR 62 | Temp 98.2°F | Wt 213.2 lb

## 2024-03-30 DIAGNOSIS — I10 Essential (primary) hypertension: Secondary | ICD-10-CM

## 2024-03-30 DIAGNOSIS — E785 Hyperlipidemia, unspecified: Secondary | ICD-10-CM | POA: Diagnosis not present

## 2024-03-30 DIAGNOSIS — E1140 Type 2 diabetes mellitus with diabetic neuropathy, unspecified: Secondary | ICD-10-CM | POA: Diagnosis not present

## 2024-03-30 DIAGNOSIS — Z23 Encounter for immunization: Secondary | ICD-10-CM | POA: Diagnosis not present

## 2024-03-30 LAB — POCT GLYCOSYLATED HEMOGLOBIN (HGB A1C): Hemoglobin A1C: 6.8 % — AB (ref 4.0–5.6)

## 2024-03-30 NOTE — Progress Notes (Signed)
 Established Patient Office Visit  Subjective   Patient ID: Jared Byrd, male    DOB: June 13, 1951  Age: 73 y.o. MRN: [id number]  Chief Complaint  Patient presents with   Annual Exam    HPI   Mr. Rao is seen for medical follow-up.  He has history of type 2 diabetes, hyperlipidemia, hypertension.  He has done excellent job with weight loss since last visit and has lost about 15 pounds by scaling back carbohydrates predominantly.  Unfortunately, 55 year old brother recently diagnosed with gastric cancer.  Anthony's A1c last January was up to 9.3 and 7.7 last visit and down to 6.8 today.  He remains on several diabetes medications including Jardiance , Trulicity , metformin .  Tolerating medications without side effects.  Had diabetic eye exam back in March.  Does have some mild neuropathy symptoms  Hyperlipidemia treated with atorvastatin  20 mg daily.  LDL cholesterol back in January 63.  No myalgias.  Past Medical History:  Diagnosis Date   COLONIC POLYPS, HX OF 02/03/2009   DIAB W/O MENTION COMP TYPE II/UNS TYPE UNCNTRL 02/03/2009   GERD 02/03/2009   HYPERTENSION 01/31/2009   METABOLIC SYNDROME X 02/03/2009   Past Surgical History:  Procedure Laterality Date   TONSILLECTOMY Bilateral     reports that he quit smoking about 48 years ago. His smoking use included cigarettes. He started smoking about 53 years ago. He has a 5 pack-year smoking history. He has never used smokeless tobacco. He reports that he does not currently use alcohol. No history on file for drug use. family history includes Cancer in his mother and another family member; Cancer (age of onset: 59) in his sister; Diabetes in his mother; Heart disease in his father, mother, and another family member; Hyperlipidemia in an other family member; Hypertension in his father and another family member; Multiple endocrine neoplasia in his father. No Known Allergies  Review of Systems  Constitutional:  Negative for  malaise/fatigue.  Eyes:  Negative for blurred vision.  Respiratory:  Negative for shortness of breath.   Cardiovascular:  Negative for chest pain.  Neurological:  Negative for dizziness, weakness and headaches.      Objective:     There were no vitals taken for this visit. BP Readings from Last 3 Encounters:  08/15/23 134/70  08/13/22 (!) 144/70  11/18/21 116/64   Wt Readings from Last 3 Encounters:  11/11/23 215 lb (97.5 kg)  08/15/23 227 lb 6.4 oz (103.1 kg)  11/05/22 220 lb (99.8 kg)      Physical Exam Vitals reviewed.  Constitutional:      General: He is not in acute distress.    Appearance: He is well-developed. He is not ill-appearing.  HENT:     Right Ear: External ear normal.     Left Ear: External ear normal.  Eyes:     Pupils: Pupils are equal, round, and reactive to light.  Neck:     Thyroid : No thyromegaly.  Cardiovascular:     Rate and Rhythm: Normal rate and regular rhythm.  Pulmonary:     Effort: Pulmonary effort is normal. No respiratory distress.     Breath sounds: Normal breath sounds. No wheezing or rales.  Musculoskeletal:     Cervical back: Neck supple.  Skin:    Comments: Mild impairment with monofilament testing volar surface of several toes bilaterally.  He has intact sensation dorsally and more proximally.  No callus.  No lesions.  Neurological:     Mental Status: He is alert and oriented  to person, place, and time.      Results for orders placed or performed in visit on 03/30/24  POC HgB A1c  Result Value Ref Range   Hemoglobin A1C 6.8 (A) 4.0 - 5.6 %   HbA1c POC (<> result, manual entry)     HbA1c, POC (prediabetic range)     HbA1c, POC (controlled diabetic range)      Last CBC Lab Results  Component Value Date   WBC 7.0 08/15/2023   HGB 15.4 08/15/2023   HCT 46.0 08/15/2023   MCV 92.3 08/15/2023   RDW 13.3 08/15/2023   PLT 270.0 08/15/2023   Last metabolic panel Lab Results  Component Value Date   GLUCOSE 155 (H)  08/15/2023   NA 136 08/15/2023   K 4.7 08/15/2023   CL 100 08/15/2023   CO2 28 08/15/2023   BUN 23 08/15/2023   CREATININE 1.12 08/15/2023   GFR 65.44 08/15/2023   CALCIUM  10.0 08/15/2023   PROT 7.1 08/15/2023   ALBUMIN 4.9 08/15/2023   BILITOT 1.4 (H) 08/15/2023   ALKPHOS 61 08/15/2023   AST 22 08/15/2023   ALT 22 08/15/2023   Last lipids Lab Results  Component Value Date   CHOL 147 08/15/2023   HDL 43.10 08/15/2023   LDLCALC 63 08/15/2023   LDLDIRECT 147.0 08/11/2021   TRIG 205.0 (H) 08/15/2023   CHOLHDL 3 08/15/2023   Last hemoglobin A1c Lab Results  Component Value Date   HGBA1C 6.8 (A) 03/30/2024      The ASCVD Risk score (Arnett DK, et al., 2019) failed to calculate for the following reasons:   The systolic blood pressure is missing    Assessment & Plan:   #1 type 2 diabetes improved with A1c 6.8%.  Continue current positive lifestyle changes.  Continue current medication regimen.  Diabetic eye exam up-to-date.  Foot exam as above.  Needs urine microalbumin and will plan to get with labs in January  #2 hypertension stable and at goal.  Continue current blood pressure medications.  Continue low-sodium diet  #3 hyperlipidemia.  LDL back in January 63.  Continue Lipitor 20 mg daily. Continue low saturated fat diet  Flu vaccine given  Return in about 4 months (around 07/30/2024).    Wolm Scarlet, MD

## 2024-03-30 NOTE — Addendum Note (Signed)
 Addended by: METTA KRISTEN CROME on: 03/30/2024 08:16 AM   Modules accepted: Orders

## 2024-04-03 DIAGNOSIS — Z860100 Personal history of colon polyps, unspecified: Secondary | ICD-10-CM | POA: Diagnosis not present

## 2024-04-03 DIAGNOSIS — Z1211 Encounter for screening for malignant neoplasm of colon: Secondary | ICD-10-CM | POA: Diagnosis not present

## 2024-04-03 DIAGNOSIS — K5730 Diverticulosis of large intestine without perforation or abscess without bleeding: Secondary | ICD-10-CM | POA: Diagnosis not present

## 2024-05-04 DIAGNOSIS — Z800 Family history of malignant neoplasm of digestive organs: Secondary | ICD-10-CM | POA: Diagnosis not present

## 2024-05-04 DIAGNOSIS — Z860100 Personal history of colon polyps, unspecified: Secondary | ICD-10-CM | POA: Diagnosis not present

## 2024-05-04 DIAGNOSIS — Z1211 Encounter for screening for malignant neoplasm of colon: Secondary | ICD-10-CM | POA: Diagnosis not present

## 2024-05-04 DIAGNOSIS — K635 Polyp of colon: Secondary | ICD-10-CM | POA: Diagnosis not present

## 2024-05-04 DIAGNOSIS — K5730 Diverticulosis of large intestine without perforation or abscess without bleeding: Secondary | ICD-10-CM | POA: Diagnosis not present

## 2024-05-04 LAB — HM COLONOSCOPY

## 2024-06-06 ENCOUNTER — Other Ambulatory Visit: Payer: Self-pay | Admitting: Family Medicine

## 2024-07-04 ENCOUNTER — Other Ambulatory Visit: Payer: Self-pay | Admitting: Family Medicine

## 2024-07-04 DIAGNOSIS — E1140 Type 2 diabetes mellitus with diabetic neuropathy, unspecified: Secondary | ICD-10-CM

## 2024-07-30 ENCOUNTER — Encounter: Admitting: Family Medicine

## 2024-08-22 ENCOUNTER — Ambulatory Visit: Admitting: Family Medicine

## 2024-08-22 ENCOUNTER — Ambulatory Visit: Payer: Self-pay | Admitting: Family Medicine

## 2024-08-22 VITALS — BP 128/60 | HR 78 | Temp 98.0°F | Ht 73.23 in | Wt 219.8 lb

## 2024-08-22 DIAGNOSIS — E1140 Type 2 diabetes mellitus with diabetic neuropathy, unspecified: Secondary | ICD-10-CM | POA: Diagnosis not present

## 2024-08-22 DIAGNOSIS — Z0000 Encounter for general adult medical examination without abnormal findings: Secondary | ICD-10-CM | POA: Diagnosis not present

## 2024-08-22 DIAGNOSIS — I10 Essential (primary) hypertension: Secondary | ICD-10-CM

## 2024-08-22 DIAGNOSIS — Z7984 Long term (current) use of oral hypoglycemic drugs: Secondary | ICD-10-CM

## 2024-08-22 DIAGNOSIS — E785 Hyperlipidemia, unspecified: Secondary | ICD-10-CM | POA: Diagnosis not present

## 2024-08-22 DIAGNOSIS — Z125 Encounter for screening for malignant neoplasm of prostate: Secondary | ICD-10-CM | POA: Diagnosis not present

## 2024-08-22 LAB — CBC WITH DIFFERENTIAL/PLATELET
Basophils Absolute: 0 10*3/uL (ref 0.0–0.1)
Basophils Relative: 0.2 % (ref 0.0–3.0)
Eosinophils Absolute: 0.3 10*3/uL (ref 0.0–0.7)
Eosinophils Relative: 3.7 % (ref 0.0–5.0)
HCT: 47.6 % (ref 39.0–52.0)
Hemoglobin: 16 g/dL (ref 13.0–17.0)
Lymphocytes Relative: 22.4 % (ref 12.0–46.0)
Lymphs Abs: 1.7 10*3/uL (ref 0.7–4.0)
MCHC: 33.6 g/dL (ref 30.0–36.0)
MCV: 90.6 fl (ref 78.0–100.0)
Monocytes Absolute: 0.5 10*3/uL (ref 0.1–1.0)
Monocytes Relative: 6 % (ref 3.0–12.0)
Neutro Abs: 5.3 10*3/uL (ref 1.4–7.7)
Neutrophils Relative %: 67.7 % (ref 43.0–77.0)
Platelets: 282 10*3/uL (ref 150.0–400.0)
RBC: 5.25 Mil/uL (ref 4.22–5.81)
RDW: 13.6 % (ref 11.5–15.5)
WBC: 7.8 10*3/uL (ref 4.0–10.5)

## 2024-08-22 LAB — COMPREHENSIVE METABOLIC PANEL WITH GFR
ALT: 21 U/L (ref 3–53)
AST: 18 U/L (ref 5–37)
Albumin: 4.7 g/dL (ref 3.5–5.2)
Alkaline Phosphatase: 71 U/L (ref 39–117)
BUN: 22 mg/dL (ref 6–23)
CO2: 25 meq/L (ref 19–32)
Calcium: 10.2 mg/dL (ref 8.4–10.5)
Chloride: 98 meq/L (ref 96–112)
Creatinine, Ser: 1.06 mg/dL (ref 0.40–1.50)
GFR: 69.41 mL/min
Glucose, Bld: 169 mg/dL — ABNORMAL HIGH (ref 70–99)
Potassium: 4.4 meq/L (ref 3.5–5.1)
Sodium: 136 meq/L (ref 135–145)
Total Bilirubin: 1.2 mg/dL (ref 0.2–1.2)
Total Protein: 7.3 g/dL (ref 6.0–8.3)

## 2024-08-22 LAB — LIPID PANEL
Cholesterol: 151 mg/dL (ref 28–200)
HDL: 50.4 mg/dL
LDL Cholesterol: 67 mg/dL (ref 10–99)
NonHDL: 100.93
Total CHOL/HDL Ratio: 3
Triglycerides: 170 mg/dL — ABNORMAL HIGH (ref 10.0–149.0)
VLDL: 34 mg/dL (ref 0.0–40.0)

## 2024-08-22 LAB — MICROALBUMIN / CREATININE URINE RATIO
Creatinine,U: 64.5 mg/dL
Microalb Creat Ratio: 12.8 mg/g (ref 0.0–30.0)
Microalb, Ur: 0.8 mg/dL (ref 0.7–1.9)

## 2024-08-22 LAB — HEMOGLOBIN A1C: Hgb A1c MFr Bld: 8 % — ABNORMAL HIGH (ref 4.6–6.5)

## 2024-08-22 LAB — PSA, MEDICARE: PSA: 1.79 ng/mL (ref 0.10–4.00)

## 2024-08-22 MED ORDER — METOPROLOL SUCCINATE ER 50 MG PO TB24: ORAL_TABLET | ORAL | 3 refills | Status: AC

## 2024-08-22 MED ORDER — ATORVASTATIN CALCIUM 20 MG PO TABS: 20.0000 mg | ORAL_TABLET | Freq: Every day | ORAL | 3 refills | Status: AC

## 2024-08-22 MED ORDER — HYDROCHLOROTHIAZIDE 25 MG PO TABS: 12.5000 mg | ORAL_TABLET | Freq: Every day | ORAL | 3 refills | Status: AC

## 2024-08-22 MED ORDER — LISINOPRIL 40 MG PO TABS: 40.0000 mg | ORAL_TABLET | Freq: Every day | ORAL | 3 refills | Status: AC

## 2024-08-22 MED ORDER — EMPAGLIFLOZIN 10 MG PO TABS: 10.0000 mg | ORAL_TABLET | Freq: Every day | ORAL | 3 refills | Status: AC

## 2024-08-22 MED ORDER — METFORMIN HCL 500 MG PO TABS: ORAL_TABLET | ORAL | 3 refills | Status: AC

## 2024-08-22 MED ORDER — TRULICITY 1.5 MG/0.5ML ~~LOC~~ SOAJ: 1.5000 mg | SUBCUTANEOUS | 3 refills | Status: AC

## 2024-08-22 NOTE — Progress Notes (Signed)
 "  Established Patient Office Visit  Subjective   Patient ID: Jared Byrd, male    DOB: February 10, 1951  Age: 74 y.o. MRN: [id number]  Chief Complaint  Patient presents with   Annual Exam    HPI   Jared Byrd is seen today for physical exam.  He has history of hypertension, type 2 diabetes, dyslipidemia, obesity.  Last year we added Trulicity  and has had some modest weight loss and blood sugars have improved since then.  Last A1c was 6 range.  He needs refills of several medications today including metoprolol , metformin , lisinopril , HCTZ, Jardiance , Trulicity , and atorvastatin .  Has no specific complaints today.  No recent chest pains.  Appetite stable.  Does have a brother that was diagnosed last year with gastric cancer.  Health maintenance reviewed:  Health Maintenance  Topic Date Due   Diabetic kidney evaluation - Urine ACR  04/02/2010   DTaP/Tdap/Td (3 - Td or Tdap) 01/16/2022   COVID-19 Vaccine (3 - 2025-26 season) 03/19/2024   Diabetic kidney evaluation - eGFR measurement  08/14/2024   HEMOGLOBIN A1C  09/27/2024   Medicare Annual Wellness (AWV)  11/10/2024   OPHTHALMOLOGY EXAM  03/28/2025   FOOT EXAM  03/30/2025   Colonoscopy  05/04/2034   Pneumococcal Vaccine: 50+ Years  Completed   Influenza Vaccine  Completed   Hepatitis C Screening  Completed   Zoster Vaccines- Shingrix  Completed   Meningococcal B Vaccine  Aged Out   - Vaccines up-to-date with exception of tetanus -Had repeat colonoscopy last October which should be his last. -Diabetic eye exam up-to-date.  Is due for urine microalbumin.  Social history-married.  No alcohol.  Smoked only briefly in his 38s.  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  Family history significant for mother having breast cancer and type 2 diabetes.  Brother with type 2 diabetes and gastric cancer.  Father had CABG in his early 22s.  He has had 2 sisters with breast cancer and 1 also had colon  cancer.  Past Medical History:  Diagnosis Date   COLONIC POLYPS, HX OF 02/03/2009   DIAB W/O MENTION COMP TYPE II/UNS TYPE UNCNTRL 02/03/2009   GERD 02/03/2009   HYPERTENSION 01/31/2009   METABOLIC SYNDROME X 02/03/2009   Past Surgical History:  Procedure Laterality Date   TONSILLECTOMY Bilateral     reports that he quit smoking about 48 years ago. His smoking use included cigarettes. He started smoking about 53 years ago. He has a 5 pack-year smoking history. He has never used smokeless tobacco. He reports that he does not currently use alcohol. No history on file for drug use. family history includes Cancer in his mother and another family member; Cancer (age of onset: 79) in his sister; Diabetes in his mother; Heart disease in his father, mother, and another family member; Hyperlipidemia in an other family member; Hypertension in his father and another family member; Multiple endocrine neoplasia in his father. Allergies[1]   Review of Systems  Constitutional:  Negative for chills, fever, malaise/fatigue and weight loss.  HENT:  Negative for hearing loss.   Eyes:  Negative for blurred vision and double vision.  Respiratory:  Negative for cough and shortness of breath.   Cardiovascular:  Negative for chest pain, palpitations and leg swelling.  Gastrointestinal:  Negative for abdominal pain, blood in stool, constipation and diarrhea.  Genitourinary:  Negative for dysuria.  Skin:  Negative for rash.  Neurological:  Negative for dizziness, speech change, seizures, loss  of consciousness and headaches.  Psychiatric/Behavioral:  Negative for depression.       Objective:     BP 128/60   Pulse 78   Temp 98 F (36.7 C) (Oral)   Ht 6' 1.23 (1.86 m)   Wt 219 lb 12.8 oz (99.7 kg)   SpO2 97%   BMI 28.82 kg/m  BP Readings from Last 3 Encounters:  08/22/24 128/60  03/30/24 110/60  08/15/23 134/70   Wt Readings from Last 3 Encounters:  08/22/24 219 lb 12.8 oz (99.7 kg)  03/30/24 213  lb 3.2 oz (96.7 kg)  11/11/23 215 lb (97.5 kg)      Physical Exam Vitals reviewed.  Constitutional:      General: He is not in acute distress.    Appearance: He is well-developed. He is not ill-appearing.  HENT:     Head: Normocephalic and atraumatic.     Right Ear: External ear normal.     Left Ear: External ear normal.  Eyes:     Conjunctiva/sclera: Conjunctivae normal.     Pupils: Pupils are equal, round, and reactive to light.  Neck:     Thyroid : No thyromegaly.  Cardiovascular:     Rate and Rhythm: Normal rate and regular rhythm.     Heart sounds: Normal heart sounds. No murmur heard. Pulmonary:     Effort: No respiratory distress.     Breath sounds: No wheezing or rales.  Abdominal:     General: Bowel sounds are normal. There is no distension.     Palpations: Abdomen is soft. There is no mass.     Tenderness: There is no abdominal tenderness. There is no guarding or rebound.  Musculoskeletal:     Cervical back: Normal range of motion and neck supple.     Right lower leg: No edema.     Left lower leg: No edema.  Lymphadenopathy:     Cervical: No cervical adenopathy.  Skin:    Findings: No rash.  Neurological:     Mental Status: He is alert and oriented to person, place, and time.     Cranial Nerves: No cranial nerve deficit.      No results found for any visits on 08/22/24.    The 10-year ASCVD risk score (Arnett DK, et al., 2019) is: 40.7%    Assessment & Plan:   Problem List Items Addressed This Visit       Unprioritized   Type 2 diabetes mellitus with diabetic neuropathy, unspecified (HCC)   Relevant Medications   metFORMIN  (GLUCOPHAGE ) 500 MG tablet   lisinopril  (ZESTRIL ) 40 MG tablet   atorvastatin  (LIPITOR) 20 MG tablet   empagliflozin  (JARDIANCE ) 10 MG TABS tablet   Dulaglutide  (TRULICITY ) 1.5 MG/0.5ML SOAJ   Other Relevant Orders   Hemoglobin A1c   Microalbumin / creatinine urine ratio   Essential hypertension   Relevant Medications    metoprolol  succinate (TOPROL -XL) 50 MG 24 hr tablet   lisinopril  (ZESTRIL ) 40 MG tablet   hydrochlorothiazide  (HYDRODIURIL ) 25 MG tablet   atorvastatin  (LIPITOR) 20 MG tablet   Dyslipidemia   Relevant Medications   atorvastatin  (LIPITOR) 20 MG tablet   Dulaglutide  (TRULICITY ) 1.5 MG/0.5ML SOAJ   Other Relevant Orders   Lipid panel   CMP   Other Visit Diagnoses       Physical exam    -  Primary   Relevant Medications   empagliflozin  (JARDIANCE ) 10 MG TABS tablet   Other Relevant Orders   CBC with Differential/Platelet  Prostate cancer screening       Relevant Orders   PSA, Medicare     74 year old white male here for physical exam.  Chronic medical problems as above.  We discussed several health maintenance issues as follows  -Obtain multiple screening labs as above - Discussed pros and cons of PSA screening and he prefers to go ahead with repeat PSA - Continue annual diabetic eye exam - Needs urine microalbumin screen and this will be added to labs - Aged out of further screening colonoscopies - Refilled all regular medications for 1 year  No follow-ups on file.    Wolm Scarlet, MD     [1] No Known Allergies  "

## 2024-08-24 ENCOUNTER — Telehealth: Payer: Self-pay

## 2024-08-24 NOTE — Telephone Encounter (Signed)
 See results note.

## 2024-08-24 NOTE — Telephone Encounter (Signed)
 Copied from CRM [id number]. Topic: Clinical - Lab/Test Results >> Aug 24, 2024  8:55 AM Adelita E wrote: Reason for CRM: Patient returning missed call. Relayed note that PCP attached with lab results. Patient stated that he would rather focus on diet and lifestyle changes first, and then reassess A1c in 3 months.

## 2024-11-16 ENCOUNTER — Ambulatory Visit

## 2024-11-21 ENCOUNTER — Ambulatory Visit: Admitting: Family Medicine
